# Patient Record
Sex: Female | Born: 1960 | Race: White | Hispanic: No | Marital: Married | Smoking: Never smoker
Health system: Southern US, Community
[De-identification: ages and names within clinical notes are randomized; demographics above are authoritative.]

## PROBLEM LIST (undated history)

## (undated) DIAGNOSIS — F32A Depression, unspecified: Secondary | ICD-10-CM

## (undated) DIAGNOSIS — R51 Headache: Secondary | ICD-10-CM

## (undated) DIAGNOSIS — F329 Major depressive disorder, single episode, unspecified: Secondary | ICD-10-CM

## (undated) DIAGNOSIS — M1990 Unspecified osteoarthritis, unspecified site: Secondary | ICD-10-CM

## (undated) DIAGNOSIS — I499 Cardiac arrhythmia, unspecified: Secondary | ICD-10-CM

## (undated) DIAGNOSIS — K219 Gastro-esophageal reflux disease without esophagitis: Secondary | ICD-10-CM

## (undated) DIAGNOSIS — D219 Benign neoplasm of connective and other soft tissue, unspecified: Secondary | ICD-10-CM

## (undated) DIAGNOSIS — E669 Obesity, unspecified: Secondary | ICD-10-CM

## (undated) DIAGNOSIS — R519 Headache, unspecified: Secondary | ICD-10-CM

## (undated) DIAGNOSIS — K760 Fatty (change of) liver, not elsewhere classified: Secondary | ICD-10-CM

## (undated) HISTORY — PX: DILATION AND CURETTAGE OF UTERUS: SHX78

## (undated) HISTORY — PX: APPENDECTOMY: SHX54

## (undated) HISTORY — PX: CHOLECYSTECTOMY: SHX55

## (undated) HISTORY — DX: Fatty (change of) liver, not elsewhere classified: K76.0

## (undated) HISTORY — PX: COLONOSCOPY: SHX174

## (undated) HISTORY — DX: Benign neoplasm of connective and other soft tissue, unspecified: D21.9

---

## 1997-04-21 ENCOUNTER — Ambulatory Visit (HOSPITAL_COMMUNITY): Admission: RE | Admit: 1997-04-21 | Discharge: 1997-04-21 | Payer: Self-pay | Admitting: Obstetrics and Gynecology

## 1997-05-10 ENCOUNTER — Inpatient Hospital Stay (HOSPITAL_COMMUNITY): Admission: AD | Admit: 1997-05-10 | Discharge: 1997-05-12 | Payer: Self-pay | Admitting: Obstetrics and Gynecology

## 1997-05-10 ENCOUNTER — Encounter (HOSPITAL_COMMUNITY): Admission: RE | Admit: 1997-05-10 | Discharge: 1997-05-11 | Payer: Self-pay | Admitting: Obstetrics and Gynecology

## 1997-06-02 ENCOUNTER — Ambulatory Visit: Admission: RE | Admit: 1997-06-02 | Discharge: 1997-06-03 | Payer: Self-pay | Admitting: General Surgery

## 1998-12-20 ENCOUNTER — Encounter: Payer: Self-pay | Admitting: Obstetrics and Gynecology

## 1998-12-20 ENCOUNTER — Ambulatory Visit (HOSPITAL_COMMUNITY): Admission: RE | Admit: 1998-12-20 | Discharge: 1998-12-20 | Payer: Self-pay | Admitting: Obstetrics and Gynecology

## 1999-01-22 ENCOUNTER — Ambulatory Visit (HOSPITAL_COMMUNITY): Admission: RE | Admit: 1999-01-22 | Discharge: 1999-01-22 | Payer: Self-pay | Admitting: Obstetrics and Gynecology

## 1999-01-22 ENCOUNTER — Encounter: Payer: Self-pay | Admitting: Obstetrics and Gynecology

## 1999-08-22 ENCOUNTER — Other Ambulatory Visit: Admission: RE | Admit: 1999-08-22 | Discharge: 1999-08-22 | Payer: Self-pay | Admitting: Obstetrics and Gynecology

## 1999-08-22 ENCOUNTER — Ambulatory Visit (HOSPITAL_COMMUNITY): Admission: RE | Admit: 1999-08-22 | Discharge: 1999-08-22 | Payer: Self-pay | Admitting: Obstetrics and Gynecology

## 1999-08-22 ENCOUNTER — Encounter: Payer: Self-pay | Admitting: Obstetrics and Gynecology

## 1999-11-05 ENCOUNTER — Other Ambulatory Visit: Admission: RE | Admit: 1999-11-05 | Discharge: 1999-11-05 | Payer: Self-pay | Admitting: Obstetrics and Gynecology

## 2002-06-06 ENCOUNTER — Ambulatory Visit (HOSPITAL_COMMUNITY): Admission: RE | Admit: 2002-06-06 | Discharge: 2002-06-06 | Payer: Self-pay | Admitting: Obstetrics and Gynecology

## 2002-06-06 ENCOUNTER — Encounter: Payer: Self-pay | Admitting: Obstetrics and Gynecology

## 2003-08-18 ENCOUNTER — Encounter: Admission: RE | Admit: 2003-08-18 | Discharge: 2003-08-18 | Payer: Self-pay | Admitting: Obstetrics and Gynecology

## 2004-06-25 ENCOUNTER — Ambulatory Visit: Payer: Self-pay | Admitting: Nurse Practitioner

## 2005-03-05 ENCOUNTER — Encounter: Admission: RE | Admit: 2005-03-05 | Discharge: 2005-03-05 | Payer: Self-pay | Admitting: Obstetrics and Gynecology

## 2009-05-21 ENCOUNTER — Observation Stay: Payer: Self-pay | Admitting: Internal Medicine

## 2009-09-19 ENCOUNTER — Ambulatory Visit: Payer: Self-pay | Admitting: Gastroenterology

## 2012-05-05 ENCOUNTER — Ambulatory Visit: Payer: Self-pay | Admitting: Gastroenterology

## 2012-05-07 LAB — PATHOLOGY REPORT

## 2012-06-23 ENCOUNTER — Ambulatory Visit: Payer: Self-pay | Admitting: Family

## 2012-06-24 ENCOUNTER — Ambulatory Visit: Payer: Self-pay | Admitting: Family

## 2012-08-10 ENCOUNTER — Ambulatory Visit: Payer: Self-pay | Admitting: Family

## 2012-08-24 ENCOUNTER — Ambulatory Visit: Payer: Self-pay | Admitting: Family

## 2013-12-09 ENCOUNTER — Observation Stay: Payer: Self-pay | Admitting: Internal Medicine

## 2013-12-09 LAB — CBC
HCT: 41.9 % (ref 35.0–47.0)
HGB: 13.7 g/dL (ref 12.0–16.0)
MCH: 28.5 pg (ref 26.0–34.0)
MCHC: 32.6 g/dL (ref 32.0–36.0)
MCV: 87 fL (ref 80–100)
Platelet: 246 10*3/uL (ref 150–440)
RBC: 4.8 10*6/uL (ref 3.80–5.20)
RDW: 14 % (ref 11.5–14.5)
WBC: 8.1 10*3/uL (ref 3.6–11.0)

## 2013-12-09 LAB — BASIC METABOLIC PANEL
Anion Gap: 9 (ref 7–16)
BUN: 11 mg/dL (ref 7–18)
CO2: 27 mmol/L (ref 21–32)
Calcium, Total: 8.6 mg/dL (ref 8.5–10.1)
Chloride: 105 mmol/L (ref 98–107)
Creatinine: 0.94 mg/dL (ref 0.60–1.30)
EGFR (African American): >60
EGFR (Non-African Amer.): >60
Glucose: 81 mg/dL (ref 65–99)
Osmolality: 280 (ref 275–301)
Potassium: 3.8 mmol/L (ref 3.5–5.1)
Sodium: 141 mmol/L (ref 136–145)

## 2013-12-09 LAB — HEPATIC FUNCTION PANEL A (ARMC)
ALK PHOS: 81 U/L
ALT: 39 U/L
Albumin: 3.8 g/dL (ref 3.4–5.0)
Bilirubin, Direct: 0.1 mg/dL (ref 0.00–0.20)
Bilirubin,Total: 0.5 mg/dL (ref 0.2–1.0)
SGOT(AST): 29 U/L (ref 15–37)
Total Protein: 6.8 g/dL (ref 6.4–8.2)

## 2013-12-09 LAB — TROPONIN I: Troponin-I: <0.02 ng/mL

## 2013-12-09 LAB — LIPASE, BLOOD: LIPASE: 83 U/L (ref 73–393)

## 2013-12-10 LAB — CK-MB
CK-MB: 1 ng/mL (ref 0.5–3.6)
CK-MB: 1 ng/mL (ref 0.5–3.6)
CK-MB: 1.2 ng/mL (ref 0.5–3.6)

## 2013-12-10 LAB — TROPONIN I: Troponin-I: <0.02 ng/mL

## 2014-06-15 ENCOUNTER — Emergency Department
Admit: 2014-06-15 | Disposition: A | Discharge status: Home / Self Care | Payer: Self-pay | Admitting: Emergency Medicine

## 2014-06-15 LAB — COMPREHENSIVE METABOLIC PANEL
ALBUMIN: 4.4 g/dL
ALK PHOS: 66 U/L
Anion Gap: 7 (ref 7–16)
BILIRUBIN TOTAL: 0.7 mg/dL
BUN: 11 mg/dL
CREATININE: 0.94 mg/dL
Calcium, Total: 9.1 mg/dL
Chloride: 107 mmol/L
Co2: 24 mmol/L
EGFR (African American): >60
EGFR (Non-African Amer.): >60
GLUCOSE: 97 mg/dL
Potassium: 4.4 mmol/L
SGOT(AST): 36 U/L
SGPT (ALT): 33 U/L
SODIUM: 138 mmol/L
Total Protein: 7.4 g/dL

## 2014-06-15 LAB — TROPONIN I

## 2014-06-15 LAB — CBC
HCT: 40.6 % (ref 35.0–47.0)
HGB: 13.6 g/dL (ref 12.0–16.0)
MCH: 29.4 pg (ref 26.0–34.0)
MCHC: 33.4 g/dL (ref 32.0–36.0)
MCV: 88 fL (ref 80–100)
Platelet: 221 10*3/uL (ref 150–440)
RBC: 4.61 10*6/uL (ref 3.80–5.20)
RDW: 13.9 % (ref 11.5–14.5)
WBC: 5.5 10*3/uL (ref 3.6–11.0)

## 2014-06-15 LAB — CK TOTAL AND CKMB (NOT AT ARMC)
CK, Total: 117 U/L
CK-MB: 2.5 ng/mL

## 2014-06-15 LAB — PROTIME-INR
INR: 0.9
Prothrombin Time: 12.4 secs

## 2014-06-15 LAB — APTT: ACTIVATED PTT: 28.1 s (ref 23.6–35.9)

## 2014-06-17 NOTE — Discharge Summary (Signed)
PATIENT NAME:  Nancy Sanford, Nancy Sanford MR#:  [id number] DATE OF BIRTH:  13-Jul-1960  DATE OF ADMISSION:  12/09/2013 DATE OF DISCHARGE:  12/10/2013  PRIMARY CARE PHYSICIAN: Irven Easterly. Kary Kos, MD.  FINAL DIAGNOSES: 1. Chest pain, likely gastroesophageal reflux disease.  2. Irritable bowel syndrome.  3. Obesity, body mass index 44.6.   MEDICATIONS ON DISCHARGE: Include: Multivitamin 1 tablet daily, dicyclomine 10 mg 2 capsules twice a day, Celexa 20 mg daily, omeprazole 20 mg 1 capsule twice a day. Stop taking Aleve.   HOSPITAL COURSE: Recommended to elevate the head of the bed at night. Diet: Regular diet, bland diet. Avoid spicy foods, regular consistency. Follow up with  Dr. Kary Kos in 1 to 2 weeks. The patient was admitted as an observation 12/09/2013 and discharged 12/10/2013. Came in with chest pain believed to be gastroesophageal reflux in nature by the admitting physician. Serial cardiac enzymes were done. No further testing was ordered. Laboratory data that was done in the emergency room included a lipase of 83. Liver function tests normal range. Glucose 81, BUN 11, creatinine 0.94, sodium 141, potassium 3.8, chloride 105, CO2 of 27, calcium 8.6. White blood cell count 8.1, H and H 13.7 and 41.9, platelet count 246,000. Troponin negative x 3. Chest x-ray negative. CT scan of the chest: No pulmonary embolism. Ectatic ascending thoracic aorta measuring 3.7 cm, but no evidence of aortic dissection.  1. On 10/17, the patient was feeling well. Her chest pain was described as pain coming off the center of her chest, radiating also across the top of her abdomen. Happens only at night for the past couple nights, but last night here in the hospital, it was okay. The patient does take Aleve at home. I believe this is likely secondary to gastroesophageal reflux disease. I increased her omeprazole to 20 mg twice a day, stopping Aleve and watching dietary modifications. Follow up with Dr. Kary Kos as an outpatient.   2. For IBS, she is on dicyclomine.  3. For obesity, BMI 44.6, weight loss is needed.   TIME SPENT ON DISCHARGE: 35 minutes.     ____________________________ Tana Conch. Leslye Peer, MD rjw:TT D: 12/10/2013 14:26:14 ET T: 12/10/2013 14:47:55 ET JOB#: [id number]  cc: Tana Conch. Leslye Peer, MD, <Dictator> Irven Easterly. Kary Kos, MD Marisue Brooklyn MD ELECTRONICALLY SIGNED 12/11/2013 12:51

## 2014-06-17 NOTE — H&P (Signed)
PATIENT NAME:  Nancy Sanford, Nancy Sanford MR#:  [id number] DATE OF BIRTH:  Aug 18, 1960  DATE OF ADMISSION:  12/09/2013  PRIMARY CARE PHYSICIAN: Kerin Perna, MD  REFERRING PHYSICIAN: Baird Cancer. Jacqualine Code, MD  CHIEF COMPLAINT: Chest pain.  HISTORY OF PRESENT ILLNESS: Nancy Sanford is a 54 year old female with a history of IBS, gastroesophageal reflux disease, osteoarthritis, obesity, comes to the Emergency Department with complaints of chest pain. The patient states she has been feeling generalized weakness for over the last 1 month; however, in the last 2 days, the patient has been experiencing severe pain, especially lying down in the bed; chest pain, pressure-like pain. The patient denies having any chest pain with exertion. This pain was associated with shortness of breath. Concerning this, came to the Emergency Department.   Workup in the Emergency Department with EKG and cardiac enzymes was unremarkable.  Concerning the patient's multiple risk factors, the decision was made to admit the patient for further observation.  PAST MEDICAL HISTORY:  1.  Irritable bowel syndrome.  2.  Gastroesophageal reflux disease.  3.  Osteoarthritis. 4.  Irregular heartbeats. 5.  Migraine headaches.   PAST SURGICAL HISTORY:  1.  Cholecystectomy. 2.  Appendectomy.  ALLERGIES: No known drug allergies.  HOME MEDICATIONS: 1.  Omeprazole 20 mg once a day. 2.  Multivitamin once a day. 3.  Dicyclomine 20 mg 3 times a day. 4.  Celexa 20 mg once a day. 5.  Aleve 220 mg 2 capsules once a day.   SOCIAL HISTORY: No history of smoking, drinking alcohol or using illicit drugs. Married; lives with her husband.  FAMILY HISTORY: Father living; has a history of coronary artery bypass grafting. Mother with no medical problems.  REVIEW OF SYSTEMS:  CONSTITUTIONAL: Experiencing generalized weakness. EYES: No change in vision. EARS, NOSE, AND THROAT: No change in hearing.  RESPIRATORY: No cough or shortness of breath.   CARDIOVASCULAR: Has chest pain. No palpitations.  GASTROINTESTINAL: Has gastroesophageal reflux disease.  GENITOURINARY: No dysuria or hematuria. HEMATOLOGIC: No easy bruising or bleeding.  SKIN: No rash or lesions. MUSCULOSKELETAL: Has history of osteoarthritis.   PHYSICAL EXAMINATION:  GENERAL: This is a well built, well nourished female with a BMI of 45, who comes to the Emergency Department with a complaint of chest pain. She is lying down in the bed, not in distress. VITAL SIGNS: Temperature 98.2, pulse 59, blood pressure 123/76, respiratory rate of 16, oxygen saturation 95% on room air. HEENT: Head normocephalic, atraumatic. There is no scleral icterus. Conjunctivae are normal. Pupils are equal, round, react to light. Mucous membranes moist. No pharyngeal erythema. NECK: Supple. No lymphadenopathy. No JVD. No carotid bruit.  CHEST: No focal tenderness. Lungs bilaterally clear to auscultation.  HEART: S1 and S2, regular. No murmurs are heard. ABDOMEN: Bowel sounds present. Soft. Has tenderness in the epigastric area. No rebound or guarding.  EXTREMITIES: No pedal edema. Pulses 2+. NEUROLOGIC: The patient is alert, oriented to place, person and time. Cranial nerves II through XII intact. Motor 5/5 in the upper and lower extremities.   LABORATORIES: Laboratory workup is completely unremarkable. Troponin less than 0.02.   ELECTROCARDIOGRAM, 12 LEAD: Normal sinus rhythm. No ST-T wave abnormalities.   CHEST X-RAY, PA AND LATERAL: No active disease in the chest.  CT OF THE CHEST: No acute cardiopulmonary disease. A small amount of pericardial fluid. Ectatic ascending thoracic aorta measuring up to 3.7 cm.   ASSESSMENT AND PLAN: Nancy Sanford is a 54 year old female who comes to the Emergency Department with chest  pain upon lying down in the bed.  1.  Chest pain. Seems to be more from a gastrointestinal cause. Admit the patient to a monitored bed, cycle cardiac enzymes x 3. If negative, the  patient can be discharged to home and have a stress test done as an outpatient.  2.  Gastroesophageal reflux disease. Keep the patient on Protonix. 3.  Hypertension currently well controlled. 4.  Keep the patient on deep venous thrombosis prophylaxis with Lovenox.   TIME SPENT: 50 minutes     ____________________________ Monica Becton, MD pv:MT D: 12/10/2013 04:50:00 ET T: 12/10/2013 06:09:50 ET JOB#: [id number]  cc: Monica Becton, MD, <Dictator> Kerin Perna, MD Monica Becton MD ELECTRONICALLY SIGNED 12/24/2013 23:17

## 2014-06-19 ENCOUNTER — Emergency Department
Admit: 2014-06-19 | Disposition: A | Discharge status: Home / Self Care | Payer: Self-pay | Admitting: Emergency Medicine

## 2014-06-19 LAB — BASIC METABOLIC PANEL
Anion Gap: 7 (ref 7–16)
BUN: 12 mg/dL
CALCIUM: 9.6 mg/dL
CREATININE: 0.92 mg/dL
Chloride: 106 mmol/L
Co2: 27 mmol/L
EGFR (African American): >60
Glucose: 115 mg/dL — ABNORMAL HIGH
Potassium: 4.2 mmol/L
SODIUM: 140 mmol/L

## 2014-06-19 LAB — CBC
HCT: 40.5 % (ref 35.0–47.0)
HGB: 14.2 g/dL (ref 12.0–16.0)
MCH: 30.5 pg (ref 26.0–34.0)
MCHC: 34.9 g/dL (ref 32.0–36.0)
MCV: 87 fL (ref 80–100)
Platelet: 258 10*3/uL (ref 150–440)
RBC: 4.65 10*6/uL (ref 3.80–5.20)
RDW: 13.9 % (ref 11.5–14.5)
WBC: 6.7 10*3/uL (ref 3.6–11.0)

## 2014-06-19 LAB — PRO B NATRIURETIC PEPTIDE: B-Type Natriuretic Peptide: 27 pg/mL

## 2014-06-19 LAB — TROPONIN I: Troponin-I: <0.03 ng/mL

## 2016-01-25 ENCOUNTER — Encounter: Payer: Self-pay | Admitting: Emergency Medicine

## 2016-01-25 ENCOUNTER — Emergency Department
Admission: EM | Admit: 2016-01-25 | Discharge: 2016-01-25 | Disposition: A | Discharge status: Home / Self Care | Payer: BC Managed Care – PPO | Attending: Emergency Medicine | Admitting: Emergency Medicine

## 2016-01-25 ENCOUNTER — Emergency Department: Payer: BC Managed Care – PPO

## 2016-01-25 DIAGNOSIS — R079 Chest pain, unspecified: Secondary | ICD-10-CM | POA: Diagnosis present

## 2016-01-25 DIAGNOSIS — R42 Dizziness and giddiness: Secondary | ICD-10-CM | POA: Insufficient documentation

## 2016-01-25 DIAGNOSIS — R0789 Other chest pain: Secondary | ICD-10-CM | POA: Diagnosis not present

## 2016-01-25 HISTORY — DX: Cardiac arrhythmia, unspecified: I49.9

## 2016-01-25 LAB — BASIC METABOLIC PANEL
Anion gap: 9 (ref 5–15)
BUN: 11 mg/dL (ref 6–20)
CHLORIDE: 109 mmol/L (ref 101–111)
CO2: 22 mmol/L (ref 22–32)
Calcium: 9.5 mg/dL (ref 8.9–10.3)
Creatinine, Ser: 1 mg/dL (ref 0.44–1.00)
GFR calc non Af Amer: >60 mL/min (ref >60)
Glucose, Bld: 86 mg/dL (ref 65–99)
POTASSIUM: 4.3 mmol/L (ref 3.5–5.1)
SODIUM: 140 mmol/L (ref 135–145)

## 2016-01-25 LAB — CBC
HEMATOCRIT: 41.6 % (ref 35.0–47.0)
Hemoglobin: 14.2 g/dL (ref 12.0–16.0)
MCH: 29.5 pg (ref 26.0–34.0)
MCHC: 34.1 g/dL (ref 32.0–36.0)
MCV: 86.5 fL (ref 80.0–100.0)
Platelets: 191 10*3/uL (ref 150–440)
RBC: 4.81 MIL/uL (ref 3.80–5.20)
RDW: 14.5 % (ref 11.5–14.5)
WBC: 4.7 10*3/uL (ref 3.6–11.0)

## 2016-01-25 LAB — TROPONIN I
Troponin I: <0.03 ng/mL (ref <0.03)
Troponin I: <0.03 ng/mL (ref <0.03)

## 2016-01-25 MED ORDER — KETOROLAC TROMETHAMINE 30 MG/ML IJ SOLN
30.0000 mg | Freq: Once | INTRAMUSCULAR | Status: AC
  Administered 2016-01-25: 30 mg via INTRAVENOUS
  Filled 2016-01-25: qty 1

## 2016-01-25 NOTE — ED Provider Notes (Signed)
[facility] Emergency Department Provider Note  ____________________________________________   First MD Initiated Contact with Patient 01/25/16 0940     (approximate)  I have reviewed the triage vital signs and the nursing notes.   HISTORY  Chief Complaint Chest Pain   HPI Nancy Sanford is a 55 y.o. female with a history of an irregular heartbeatand a family history of coronary artery disease was presenting with 1 day of left-sided chest pain. Says that the chest pain feels like an aching and has been radiating from the left upper chest to the center chest as well as to left arm. She says that she has had some dizziness but denies any shortness of breath, nausea, vomiting or diaphoresis. She says that the chest pain has been constant and a 5 but for about 30 seconds to 1 minute will elevate up to a 10. She says that it feels tight when she moves her arm and that 4 days ago she rates some leaves and thought that she may have pulled a muscle. However, she took Tylenol without any relief last night.   Past Medical History:  Diagnosis Date  . Irregular heart beat     There are no active problems to display for this patient.   Past Surgical History:  Procedure Laterality Date  . APPENDECTOMY    . CHOLECYSTECTOMY      Prior to Admission medications   Medication Sig Start Date End Date Taking? Authorizing Provider  Multiple Vitamin (MULTIVITAMIN) tablet Take 1 tablet by mouth daily.   Yes Historical Provider, MD    Allergies Patient has no known allergies.  No family history on file.  Social History Social History  Substance Use Topics  . Smoking status: Never Smoker  . Smokeless tobacco: Never Used  . Alcohol use No    Review of Systems Constitutional: No fever/chills Eyes: No visual changes. ENT: No sore throat. Cardiovascular: As above Respiratory: Denies shortness of breath. Gastrointestinal: No abdominal pain.  No nausea, no  vomiting.  No diarrhea.  No constipation. Genitourinary: Negative for dysuria. Musculoskeletal: Negative for back pain. Skin: Negative for rash. Neurological: Negative for headaches, focal weakness or numbness.  10-point ROS otherwise negative.  ____________________________________________   PHYSICAL EXAM:  VITAL SIGNS: ED Triage Vitals [01/25/16 0916]  Enc Vitals Group     BP 132/63     Pulse Rate (!) 58     Resp 18     Temp 98 F (36.7 C)     Temp Source Oral     SpO2 100 %     Weight 230 lb (104.3 kg)     Height 5\' 2"  (1.575 m)     Head Circumference      Peak Flow      Pain Score 5     Pain Loc      Pain Edu?      Excl. in [place]?     Constitutional: Alert and oriented. Well appearing and in no acute distress. Eyes: Conjunctivae are normal. PERRL. EOMI. Head: Atraumatic. Nose: No congestion/rhinnorhea. Mouth/Throat: Mucous membranes are moist.  Neck: No stridor.   Cardiovascular: Normal rate, regular rhythm. Grossly normal heart sounds.  Equal bilateral radial pulses. Reproducible tenderness when pushing on the left side of the chest. Respiratory: Normal respiratory effort.  No retractions. Lungs CTAB. Gastrointestinal: Soft and nontender. No distention. No CVA tenderness. Musculoskeletal: No lower extremity tenderness nor edema.  No joint effusions. Neurologic:  Normal speech and language. No gross focal  neurologic deficits are appreciated.  Skin:  Skin is warm, dry and intact. No rash noted. Psychiatric: Mood and affect are normal. Speech and behavior are normal.  ____________________________________________   LABS (all labs ordered are listed, but only abnormal results are displayed)  Labs Reviewed  BASIC METABOLIC PANEL  CBC  TROPONIN I  TROPONIN I   ____________________________________________  EKG  ED ECG REPORT I, [place],  Youlanda Roys, the attending physician, personally viewed and interpreted this ECG.   Date: 01/25/2016  EKG Time: 0922  Rate:  58  Rhythm: normal sinus rhythm  Axis: Normal axis  Intervals:none  ST&T Change: No ST segment elevation or depression. T-wave inversions in V2 and V3.  ____________________________________________  RADIOLOGY DG Chest 2 View (Final result)  Result time 01/25/16 10:15:35  Final result by Lowella Grip III, MD (01/25/16 10:15:35)           Narrative:   CLINICAL DATA: Left-sided chest pain radiating into left upper extremity. Shortness of breath. Dizziness.  EXAM: CHEST 2 VIEW  COMPARISON: June 15, 2014  FINDINGS: Lungs are clear. Heart size and pulmonary vascularity are normal. No adenopathy. No bone lesions. No pneumothorax.  IMPRESSION: No edema or consolidation.   Electronically Signed By: Lowella Grip III M.D. On: 01/25/2016 10:15             ____________________________________________   PROCEDURES  Procedure(s) performed:   Procedures  Critical Care performed:   ____________________________________________   INITIAL IMPRESSION / ASSESSMENT AND PLAN / ED COURSE  Pertinent labs & imaging results that were available during my care of the patient were reviewed by me and considered in my medical decision making (see chart for details).   Clinical Course    ----------------------------------------- 1:31 PM on 01/25/2016 -----------------------------------------  Patient resting comfortably at this time and says that her pain is relieved after the Toradol. Very reassuring lab workup. Nonspecific changes on the EKG. Previous EKGs with inverted T waves in V2. Now with an inverted T-wave in V2 as well as V3. However, history and physical strongly point towards chest wall pain. Will be discharged home. Will be following up with cardiology in the office. She understands plan and is willing to comply as well as to return for any worsening or concerning symptoms.  ____________________________________________   FINAL CLINICAL  IMPRESSION(S) / ED DIAGNOSES  Chest wall pain.    NEW MEDICATIONS STARTED DURING THIS VISIT:  New Prescriptions   No medications on file     Note:  This document was prepared using Dragon voice recognition software and may include unintentional dictation errors.    Orbie Pyo, MD 01/25/16 1332

## 2016-01-25 NOTE — ED Notes (Signed)
Attempted to start IV x1. Unable to get Iv.

## 2016-01-25 NOTE — ED Notes (Signed)
Patient transported to X-ray 

## 2016-01-25 NOTE — ED Triage Notes (Signed)
Pt reports left side chest pain that radiates to left arm that began last night. Pt reports SOB, dizziness and lightheadedness. Pt describes pain as squeezing. Pt ambulatory to triage. No apparent distress noted.

## 2016-08-08 ENCOUNTER — Ambulatory Visit (INDEPENDENT_AMBULATORY_CARE_PROVIDER_SITE_OTHER): Payer: BC Managed Care – PPO | Admitting: Certified Nurse Midwife

## 2016-08-08 ENCOUNTER — Encounter: Payer: Self-pay | Admitting: Certified Nurse Midwife

## 2016-08-08 VITALS — BP 133/83 | HR 59 | Ht 62.0 in | Wt 184.5 lb

## 2016-08-08 DIAGNOSIS — Z01419 Encounter for gynecological examination (general) (routine) without abnormal findings: Secondary | ICD-10-CM

## 2016-08-08 DIAGNOSIS — N941 Unspecified dyspareunia: Secondary | ICD-10-CM

## 2016-08-08 MED ORDER — SERTRALINE HCL 100 MG PO TABS: 100.0000 mg | ORAL_TABLET | Freq: Every day | ORAL | 0 refills | Status: DC

## 2016-08-08 NOTE — Patient Instructions (Signed)
Atrophic Vaginitis Atrophic vaginitis is when the tissues that line the vagina become dry and thin. This is caused by a drop in estrogen. Estrogen helps:  To keep the vagina moist.  To make a clear fluid that helps: ? To lubricate the vagina for sex. ? To protect the vagina from infection.  If the lining of the vagina is dry and thin, it may:  Make sex painful. It may also cause bleeding.  Cause a feeling of: ? Burning. ? Irritation. ? Itchiness.  Make an exam of your vagina painful. It may also cause bleeding.  Make you lose interest in sex.  Cause a burning feeling when you pee.  Make your vaginal fluid (discharge) brown or yellow.  For some women, there are no symptoms. This condition is most common in women who do not get their regular menstrual periods anymore (menopause). This often starts when a woman is 67-65 years old. Follow these instructions at home:  Take medicines only as told by your doctor. Do not use any herbal or alternative medicines unless your doctor says it is okay.  Use over-the-counter products for dryness only as told by your doctor. These include: ? Creams. ? Lubricants. ? Moisturizers.  Do not douche.  Do not use products that can make your vagina dry. These include: ? Scented feminine sprays. ? Scented tampons. ? Scented soaps.  If it hurts to have sex, tell your sexual partner. Contact a doctor if:  Your discharge looks different than normal.  Your vagina has an unusual smell.  You have new symptoms.  Your symptoms do not get better with treatment.  Your symptoms get worse. This information is not intended to replace advice given to you by your health care provider. Make sure you discuss any questions you have with your health care provider. Document Released: 07/30/2007 Document Revised: 07/19/2015 Document Reviewed: 02/01/2014 Elsevier Interactive Patient Education  2018 [employer]. Persistent Depressive Disorder Persistent  depressive disorder (PDD) is a mental health condition. PDD causes symptoms of low-level depression for 2 years or longer. It may also be called long-term (chronic) depression or dysthymia. PDD may include episodes of more severe depression that last for about 2 weeks (major depressive disorder or MDD). PDD can affect the way you think, feel, and sleep. This condition may also affect your relationships. You may be more likely to get sick if you have PDD. Symptoms of PDD occur for most of the day and may include:  Feeling tired (fatigue).  Low energy.  Eating too much or too little.  Sleeping too much or too little.  Feeling restless or agitated.  Feeling hopeless.  Feeling worthless or guilty.  Feeling worried or nervous (anxiety).  Trouble concentrating or making decisions.  Low self-esteem.  A negative way of looking at things ([place]).  Not being able to have fun or feel pleasure.  Avoiding interacting with people.  Getting angry or annoyed easily (irritability).  Acting aggressive or angry.  Follow these instructions at home: Activity  Go back to your normal activities as told by your doctor.  Exercise regularly as told by your doctor. General instructions  Take over-the-counter and prescription medicines only as told by your doctor.  Do not drink alcohol. Or, limit how much alcohol you drink to no more than 1 drink a day for nonpregnant women and 2 drinks a day for men. One drink equals 12 oz of beer, 5 oz of wine, or 1 oz of hard liquor. Alcohol can affect any  antidepressant medicines you are taking. Talk with your doctor about your alcohol use.  Eat a healthy diet and get plenty of sleep.  Find activities that you enjoy each day.  Consider joining a support group. Your doctor may be able to suggest a support group.  Keep all follow-up visits as told by your doctor. This is important. Where to find more information: [employer] on Mental  Illness  www.nami.org  U.S. National Institute of Mental Health  https://carter.com/  National Suicide Prevention Lifeline  332-787-8268 954-254-8273). This is free, 24-hour help.  Contact a doctor if:  Your symptoms get worse.  You have new symptoms.  You have trouble sleeping or doing your daily activities. Get help right away if:  You self-harm.  You have serious thoughts about hurting yourself or others.  You see, hear, taste, smell, or feel things that are not there (hallucinate). This information is not intended to replace advice given to you by your health care provider. Make sure you discuss any questions you have with your health care provider. Document Released: 01/22/2015 Document Revised: 10/05/2015 Document Reviewed: 10/05/2015 Elsevier Interactive Patient Education  2017 [employer].

## 2016-08-08 NOTE — Progress Notes (Signed)
GYN ENCOUNTER NOTE  Subjective:       Nancy Sanford is a 56 y.o. G70P1011 female is here for gynecologic evaluation of the following issues:  1.Hormonal issues She states that she has been dealing with depression and dyspareunia for 2 years. The depression started when she had to quit her job due to physical issues. She has since lost 75 lbs and is working part time. She states that she is tearful all the time and is concerned about losing her husband because she has not had intercourse in 2 yrs. The last time she had intercourse was painful. She denies use of lubrication. She denies issue with desire of intercourse. She denies any other symptoms.    Gynecologic History No LMP recorded. Patient is postmenopausal. 9-10years Contraception: none Last Pap: 5 yrs ago Results were: normal per pt Last mammogram: 02/2005. Results were: normal. Mammogram ordered.   Obstetric History OB History  Gravida Para Term Preterm AB Living  2 1 1   1 1   SAB TAB Ectopic Multiple Live Births  1       1    # Outcome Date GA Lbr Len/2nd Weight Sex Delivery Anes PTL Lv  2 Term 49    M Vag-Spont   LIV  1 SAB 1998              Past Medical History:  Diagnosis Date  . Fatty liver   . Fibroid   . Irregular heart beat     Past Surgical History:  Procedure Laterality Date  . APPENDECTOMY    . CHOLECYSTECTOMY    . DILATION AND CURETTAGE OF UTERUS      No current outpatient prescriptions on file prior to visit.   No current facility-administered medications on file prior to visit.     No Known Allergies  Social History   Social History  . Marital status: Married    Spouse name: N/A  . Number of children: N/A  . Years of education: N/A   Occupational History  . Not on file.   Social History Main Topics  . Smoking status: Never Smoker  . Smokeless tobacco: Never Used  . Alcohol use No  . Drug use: No  . Sexual activity: No   Other Topics Concern  . Not on file   Social History  Narrative  . No narrative on file    Family History  Problem Relation Age of Onset  . Stroke Mother   . Diabetes Paternal Grandmother     The following portions of the patient's history were reviewed and updated as appropriate: allergies, current medications, past family history, past medical history, past social history, past surgical history and problem list.  Review of Systems Review of Systems - General ROS: negative for - chills, fatigue, fever, hot flashes, malaise or night sweats Hematological and Lymphatic ROS: negative for - bleeding problems or swollen lymph nodes Gastrointestinal ROS: negative for - abdominal pain, blood in stools, change in bowel habits and nausea/vomiting Musculoskeletal ROS: negative for - joint pain, muscle pain or muscular weakness Genito-Urinary ROS: negative for -  dysmenorrhea, dysuria, genital discharge, genital ulcers, hematuria, incontinence, vaginal bleeding, nocturia or pelvic pain. Positive for dyspareunia  Objective:   BP 133/83   Pulse (!) 59   Ht 5\' 2"  (1.575 m)   Wt 184 lb 8 oz (83.7 kg)   BMI 33.75 kg/m  CONSTITUTIONAL: Well-developed, well-nourished female in no acute distress.  HENT:  Normocephalic, atraumatic.  NECK: Normal range  of motion SKIN: Skin is warm and dry. No rash noted. Not diaphoretic. No erythema. No pallor. [place]: Alert and oriented to person, place, and time.  PSYCHIATRIC: Normal affect. tearfu lwith discussion of depression Normal behavior. Normal judgment and thought content. CARDIOVASCULAR:Regular rate, no murmurs  RESPIRATORY: Clear bilaterally  BREASTS: normal bilaterally ABDOMEN: Soft, non distended; Non tender.   PELVIC:  External Genitalia: Normal  BUS: Normal  Vagina: decreased rugae   Cervix: Normal  Uterus: Normal size, shape,consistency, mobile  Adnexa: Normal  RV: Normal   Bladder: Nontender MUSCULOSKELETAL: Normal range of motion. No tenderness.  No cyanosis, clubbing, or  edema.  Assessment:   1. Dyspareunia, female 2. Depression  Plan:  Pap smear today, mammogram ordered. Colonoscopy done at age 72 (pt pt). Encouraged pt to use lubrication with intercourse. She denies history of any cancer, cardiovascular or neurovascular accidents. Sample of Ospermifene 60 mg given with instructions on use.To use after trial of lubrication.  Zoloft 100 mg started. She denies desire to hurt herself and agrees to go to the E.D. If she has the urge to harm herself. She will follow up in 3 wks for medication check. Will call with results of pap smear.   Philip Aspen, CNM

## 2016-08-08 NOTE — Progress Notes (Signed)
New 56 year old here for a pap smear. She would also like to discuss menopause- she has a lot of questions. Her last pap smear was about 5 years and was normal.

## 2016-08-12 ENCOUNTER — Other Ambulatory Visit: Payer: Self-pay | Admitting: Certified Nurse Midwife

## 2016-08-12 ENCOUNTER — Telehealth: Payer: Self-pay | Admitting: Certified Nurse Midwife

## 2016-08-12 LAB — PAP IG AND HPV HIGH-RISK
HPV, HIGH-RISK: NEGATIVE
PAP SMEAR COMMENT: 0

## 2016-08-12 MED ORDER — OSPEMIFENE 60 MG PO TABS: 1.0000 | ORAL_TABLET | Freq: Every day | ORAL | 0 refills | Status: AC

## 2016-08-12 NOTE — Progress Notes (Signed)
Prescription sent for Ospemifene.

## 2016-08-12 NOTE — Telephone Encounter (Signed)
Called placed to review pap smear results. Left message.  Philip Aspen, CNM

## 2016-08-29 ENCOUNTER — Ambulatory Visit (INDEPENDENT_AMBULATORY_CARE_PROVIDER_SITE_OTHER): Payer: BC Managed Care – PPO | Admitting: Certified Nurse Midwife

## 2016-08-29 ENCOUNTER — Encounter: Payer: Self-pay | Admitting: Certified Nurse Midwife

## 2016-08-29 VITALS — BP 98/58 | HR 65 | Ht 62.0 in | Wt 183.9 lb

## 2016-08-29 DIAGNOSIS — Z79899 Other long term (current) drug therapy: Secondary | ICD-10-CM | POA: Diagnosis not present

## 2016-08-29 MED ORDER — SERTRALINE HCL 100 MG PO TABS: 150.0000 mg | ORAL_TABLET | Freq: Every day | ORAL | 0 refills | Status: DC

## 2016-08-29 NOTE — Progress Notes (Signed)
GYN ENCOUNTER NOTE  Subjective:       Nancy Sanford is a 56 y.o. G71P1011 female is here for gynecologic evaluation of the following issues:  1. Medication check.  She was started on 100 mg Zoloft 08/08/16 and has been using Ospermifene 60 mg for dyspareunia. She states that she has had intercourse x 1 and it was not painful. She states that her depression improved initially but she is not back to herself.    Gynecologic History No LMP recorded. Patient is postmenopausal.   Obstetric History OB History  Gravida Para Term Preterm AB Living  2 1 1   1 1   SAB TAB Ectopic Multiple Live Births  1       1    # Outcome Date GA Lbr Len/2nd Weight Sex Delivery Anes PTL Lv  2 Term 71    M Vag-Spont   LIV  1 SAB 1998              Past Medical History:  Diagnosis Date  . Fatty liver   . Fibroid   . Irregular heart beat     Past Surgical History:  Procedure Laterality Date  . APPENDECTOMY    . CHOLECYSTECTOMY    . DILATION AND CURETTAGE OF UTERUS      Current Outpatient Prescriptions on File Prior to Visit  Medication Sig Dispense Refill  . acetaminophen (TYLENOL) 325 MG tablet Take by mouth.    . Multiple Vitamin (MULTI-VITAMINS) TABS Take by mouth.    . Ospemifene 60 MG TABS Take 1 tablet by mouth daily. 30 tablet 0  . pantoprazole (PROTONIX) 20 MG tablet TAKE 1 TABLET EACH DAY BY MOUTH. *NOTE DOSE DECREASE PER PROTOCOL*  0  . sertraline (ZOLOFT) 100 MG tablet Take 1 tablet (100 mg total) by mouth daily. 25 tablet 0   No current facility-administered medications on file prior to visit.     No Known Allergies  Social History   Social History  . Marital status: Married    Spouse name: N/A  . Number of children: N/A  . Years of education: N/A   Occupational History  . Not on file.   Social History Main Topics  . Smoking status: Never Smoker  . Smokeless tobacco: Never Used  . Alcohol use No  . Drug use: No  . Sexual activity: No   Other Topics Concern  . Not  on file   Social History Narrative  . No narrative on file    Family History  Problem Relation Age of Onset  . Stroke Mother   . Diabetes Paternal Grandmother     The following portions of the patient's history were reviewed and updated as appropriate: allergies, current medications, past family history, past medical history, past social history, past surgical history and problem list.  Review of Systems Review of Systems - Negative except As mentioned in HPI Review of Systems - General ROS: negative for - chills, fatigue, fever, hot flashes, malaise or night sweats Hematological and Lymphatic ROS: negative for - bleeding problems or swollen lymph nodes Gastrointestinal ROS: negative for - abdominal pain, blood in stools, change in bowel habits and nausea/vomiting Musculoskeletal ROS: negative for - joint pain, muscle pain or muscular weakness Genito-Urinary ROS: negative for - change in menstrual cycle, dysmenorrhea, dyspareunia, dysuria, genital discharge, genital ulcers, hematuria, incontinence, irregular/heavy menses, nocturia or pelvic pain  Objective:   BP (!) 98/58   Pulse 65   Ht 5\' 2"  (1.575 m)  Wt 183 lb 14.4 oz (83.4 kg)   BMI 33.64 kg/m  CONSTITUTIONAL: Well-developed, well-nourished female in no acute distress.  HENT:  Normocephalic, atraumatic.  NECK: Normal range of motion, SKIN: Skin is warm and dry.  No pallor. [place]: Alert and oriented to person, place, and time.  PSYCHIATRIC: Normal mood and affect. Normal behavior. Normal judgment and thought content. Has periods of depression CARDIOVASCULAR:Not Examined RESPIRATORY: Not Examined BREASTS: Not Examined ABDOMEN: not EXAMINED  PELVIC:NOT EXAMINED MUSCULOSKELETAL: Normal range of motion. No tenderness.  No cyanosis, clubbing, or edema.   Assessment:   Medications Management  Plan:   Continue with Ospemifene, Zoloft increased to 150 mg daily. Return in 3 wks for medication check. Denies desire to  hurt herself.   I attest that more than 50 % of visit was spent reviewing pt history and discussing management plan.   Philip Aspen, CNM

## 2016-08-29 NOTE — Patient Instructions (Signed)
Persistent Depressive Disorder Persistent depressive disorder (PDD) is a mental health condition. PDD causes symptoms of low-level depression for 2 years or longer. It may also be called long-term (chronic) depression or dysthymia. PDD may include episodes of more severe depression that last for about 2 weeks (major depressive disorder or MDD). PDD can affect the way you think, feel, and sleep. This condition may also affect your relationships. You may be more likely to get sick if you have PDD. Symptoms of PDD occur for most of the day and may include:  Feeling tired (fatigue).  Low energy.  Eating too much or too little.  Sleeping too much or too little.  Feeling restless or agitated.  Feeling hopeless.  Feeling worthless or guilty.  Feeling worried or nervous (anxiety).  Trouble concentrating or making decisions.  Low self-esteem.  A negative way of looking at things ([place]).  Not being able to have fun or feel pleasure.  Avoiding interacting with people.  Getting angry or annoyed easily (irritability).  Acting aggressive or angry.  Follow these instructions at home: Activity  Go back to your normal activities as told by your doctor.  Exercise regularly as told by your doctor. General instructions  Take over-the-counter and prescription medicines only as told by your doctor.  Do not drink alcohol. Or, limit how much alcohol you drink to no more than 1 drink a day for nonpregnant women and 2 drinks a day for men. One drink equals 12 oz of beer, 5 oz of wine, or 1 oz of hard liquor. Alcohol can affect any antidepressant medicines you are taking. Talk with your doctor about your alcohol use.  Eat a healthy diet and get plenty of sleep.  Find activities that you enjoy each day.  Consider joining a support group. Your doctor may be able to suggest a support group.  Keep all follow-up visits as told by your doctor. This is important. Where to find more  information: National Alliance on Mental Illness  www.nami.org  [place] National Institute of Mental Health  www.nimh.nih.gov  National Suicide Prevention Lifeline  1-800-273-TALK (1-800-273-8255). This is free, 24-hour help.  Contact a doctor if:  Your symptoms get worse.  You have new symptoms.  You have trouble sleeping or doing your daily activities. Get help right away if:  You self-harm.  You have serious thoughts about hurting yourself or others.  You see, hear, taste, smell, or feel things that are not there (hallucinate). This information is not intended to replace advice given to you by your health care provider. Make sure you discuss any questions you have with your health care provider. Document Released: 01/22/2015 Document Revised: 10/05/2015 Document Reviewed: 10/05/2015 Elsevier Interactive Patient Education  2017 Elsevier Inc.  

## 2016-08-30 ENCOUNTER — Other Ambulatory Visit: Payer: Self-pay | Admitting: Certified Nurse Midwife

## 2016-09-19 ENCOUNTER — Encounter: Payer: Self-pay | Admitting: Certified Nurse Midwife

## 2016-09-19 ENCOUNTER — Ambulatory Visit (INDEPENDENT_AMBULATORY_CARE_PROVIDER_SITE_OTHER): Payer: BC Managed Care – PPO | Admitting: Certified Nurse Midwife

## 2016-09-19 VITALS — BP 96/68 | HR 67 | Ht 62.0 in | Wt 181.6 lb

## 2016-09-19 DIAGNOSIS — Z79899 Other long term (current) drug therapy: Secondary | ICD-10-CM | POA: Diagnosis not present

## 2016-09-19 MED ORDER — SERTRALINE HCL 100 MG PO TABS: 150.0000 mg | ORAL_TABLET | Freq: Every day | ORAL | 5 refills | Status: DC

## 2016-09-19 NOTE — Progress Notes (Signed)
GYN ENCOUNTER NOTE  Subjective:       Nancy Sanford is a 56 y.o. G36P1011 female is here for gynecologic evaluation of the following issues:  1.Medication check.  She was placed on zoloft after seeing me 08/08/16 , the dose was then increased in 08/12/16. She returns today for medication check. She reports that she is feeling like her self again. She is able to function well, has no bad thoughts and has been able to be intimate with her partner. She is smiling and well groomed.    Gynecologic History No LMP recorded. Patient is postmenopausal. Contraception: none Last Pap: 08/08/16 Results were: normal Last mammogram: ordered 08/08/16 Obstetric History OB History  Gravida Para Term Preterm AB Living  2 1 1   1 1   SAB TAB Ectopic Multiple Live Births  1       1    # Outcome Date GA Lbr Len/2nd Weight Sex Delivery Anes PTL Lv  2 Term 1999    M Vag-Spont   LIV  1 SAB 1998              Past Medical History:  Diagnosis Date  . Fatty liver   . Fibroid   . Irregular heart beat     Past Surgical History:  Procedure Laterality Date  . APPENDECTOMY    . CHOLECYSTECTOMY    . DILATION AND CURETTAGE OF UTERUS      Current Outpatient Prescriptions on File Prior to Visit  Medication Sig Dispense Refill  . acetaminophen (TYLENOL) 325 MG tablet Take by mouth.    . Multiple Vitamin (MULTI-VITAMINS) TABS Take by mouth.    . Ospemifene 60 MG TABS Take 1 tablet by mouth daily. 30 tablet 0  . pantoprazole (PROTONIX) 20 MG tablet TAKE 1 TABLET EACH DAY BY MOUTH. *NOTE DOSE DECREASE PER PROTOCOL*  0  . sertraline (ZOLOFT) 100 MG tablet Take 1.5 tablets (150 mg total) by mouth daily. 30 tablet 0   No current facility-administered medications on file prior to visit.     No Known Allergies  Social History   Social History  . Marital status: Married    Spouse name: N/A  . Number of children: N/A  . Years of education: N/A   Occupational History  . Not on file.   Social History Main  Topics  . Smoking status: Never Smoker  . Smokeless tobacco: Never Used  . Alcohol use No  . Drug use: No  . Sexual activity: No   Other Topics Concern  . Not on file   Social History Narrative  . No narrative on file    Family History  Problem Relation Age of Onset  . Stroke Mother   . Diabetes Paternal Grandmother     The following portions of the patient's history were reviewed and updated as appropriate: allergies, current medications, past family history, past medical history, past social history, past surgical history and problem list.  Review of Systems Review of Systems - Negative except as mentioned in HPI Review of Systems - General ROS: negative for - chills, fatigue, fever, hot flashes, malaise or night sweats Hematological and Lymphatic ROS: negative for - bleeding problems or swollen lymph nodes Gastrointestinal ROS: negative for - abdominal pain, blood in stools, change in bowel habits and nausea/vomiting Musculoskeletal ROS: negative for - joint pain, muscle pain or muscular weakness Genito-Urinary ROS: negative for - change in menstrual cycle, dysmenorrhea, dyspareunia, dysuria, genital discharge, genital ulcers, hematuria, incontinence, irregular/heavy menses, nocturia  or pelvic painjj  Objective:   There were no vitals taken for this visit. CONSTITUTIONAL: Well-developed, well-nourished female in no acute distress.  HENT:  Normocephalic, atraumatic.  NECK: Normal range of motion SKIN: Skin is warm and dry. No rash noted. Not diaphoretic. No erythema. No pallor. [place]: Alert and oriented to person, place, and time. PSYCHIATRIC: Normal mood and affect. Normal behavior. Normal judgment and thought content. CARDIOVASCULAR:Not Examined RESPIRATORY: Not Examined BREASTS: Not Examined ABDOMEN: Soft, non distended;  PELVIC:Not examined  MUSCULOSKELETAL: Normal range of motion. No tenderness.  No cyanosis, clubbing, or edema.    Assessment:    Depression   Plan:  Continue with Zoloft 150 mg PO q day. Follow up 1 yr for annual exam or PRN.   Philip Aspen, CNM

## 2016-09-19 NOTE — Patient Instructions (Signed)
Living With Depression Everyone experiences occasional disappointment, sadness, and loss in their lives. When you are feeling down, blue, or sad for at least 2 weeks in a row, it may mean that you have depression. Depression can affect your thoughts and feelings, relationships, daily activities, and physical health. It is caused by changes in the way your brain functions. If you receive a diagnosis of depression, your health care provider will tell you which type of depression you have and what treatment options are available to you. If you are living with depression, there are ways to help you recover from it and also ways to prevent it from coming back. How to cope with lifestyle changes Coping with stress Stress is your body's reaction to life changes and events, both good and bad. Stressful situations may include:  Getting married.  The death of a spouse.  Losing a job.  Retiring.  Having a baby.  Stress can last just a few hours or it can be ongoing. Stress can play a major role in depression, so it is important to learn both how to cope with stress and how to think about it differently. Talk with your health care provider or a counselor if you would like to learn more about stress reduction. He or she may suggest some stress reduction techniques, such as:  Music therapy. This can include creating music or listening to music. Choose music that you enjoy and that inspires you.  Mindfulness-based meditation. This kind of meditation can be done while sitting or walking. It involves being aware of your normal breaths, rather than trying to control your breathing.  Centering prayer. This is a kind of meditation that involves focusing on a spiritual word or phrase. Choose a word, phrase, or sacred image that is meaningful to you and that brings you peace.  Deep breathing. To do this, expand your stomach and inhale slowly through your nose. Hold your breath for 3-5 seconds, then exhale  slowly, allowing your stomach muscles to relax.  Muscle relaxation. This involves intentionally tensing muscles then relaxing them.  Choose a stress reduction technique that fits your lifestyle and personality. Stress reduction techniques take time and practice to develop. Set aside 5-15 minutes a day to do them. Therapists can offer training in these techniques. The training may be covered by some insurance plans. Other things you can do to manage stress include:  Keeping a stress diary. This can help you learn what triggers your stress and ways to control your response.  Understanding what your limits are and saying no to requests or events that lead to a schedule that is too full.  Thinking about how you respond to certain situations. You may not be able to control everything, but you can control how you react.  Adding humor to your life by watching funny films or TV shows.  Making time for activities that help you relax and not feeling guilty about spending your time this way.  Medicines Your health care provider may suggest certain medicines if he or she feels that they will help improve your condition. Avoid using alcohol and other substances that may prevent your medicines from working properly (may interact). It is also important to:  Talk with your pharmacist or health care provider about all the medicines that you take, their possible side effects, and what medicines are safe to take together.  Make it your goal to take part in all treatment decisions (shared decision-making). This includes giving input on the side   effects of medicines. It is best if shared decision-making with your health care provider is part of your total treatment plan.  If your health care provider prescribes a medicine, you may not notice the full benefits of it for 4-8 weeks. Most people who are treated for depression need to be on medicine for at least 6-12 months after they feel better. If you are taking  medicines as part of your treatment, do not stop taking medicines without first talking to your health care provider. You may need to have the medicine slowly decreased (tapered) over time to decrease the risk of harmful side effects. Relationships Your health care provider may suggest family therapy along with individual therapy and drug therapy. While there may not be family problems that are causing you to feel depressed, it is still important to make sure your family learns as much as they can about your mental health. Having your family's support can help make your treatment successful. How to recognize changes in your condition Everyone has a different response to treatment for depression. Recovery from major depression happens when you have not had signs of major depression for two months. This may mean that you will start to:  Have more interest in doing activities.  Feel less hopeless than you did 2 months ago.  Have more energy.  Overeat less often, or have better or improving appetite.  Have better concentration.  Your health care provider will work with you to decide the next steps in your recovery. It is also important to recognize when your condition is getting worse. Watch for these signs:  Having fatigue or low energy.  Eating too much or too little.  Sleeping too much or too little.  Feeling restless, agitated, or hopeless.  Having trouble concentrating or making decisions.  Having unexplained physical complaints.  Feeling irritable, angry, or aggressive.  Get help as soon as you or your family members notice these symptoms coming back. How to get support and help from others How to talk with friends and family members about your condition Talking to friends and family members about your condition can provide you with one way to get support and guidance. Reach out to trusted friends or family members, explain your symptoms to them, and let them know that you are  working with a health care provider to treat your depression. Financial resources Not all insurance plans cover mental health care, so it is important to check with your insurance carrier. If paying for co-pays or counseling services is a problem, search for a local or county mental health care center. They may be able to offer public mental health care services at low or no cost when you are not able to see a private health care provider. If you are taking medicine for depression, you may be able to get the generic form, which may be less expensive. Some makers of prescription medicines also offer help to patients who cannot afford the medicines they need. Follow these instructions at home:  Get the right amount and quality of sleep.  Cut down on using caffeine, tobacco, alcohol, and other potentially harmful substances.  Try to exercise, such as walking or lifting small weights.  Take over-the-counter and prescription medicines only as told by your health care provider.  Eat a healthy diet that includes plenty of vegetables, fruits, whole grains, low-fat dairy products, and lean protein. Do not eat a lot of foods that are high in solid fats, added sugars, or salt.    Keep all follow-up visits as told by your health care provider. This is important. Contact a health care provider if:  You stop taking your antidepressant medicines, and you have any of these symptoms: ? Nausea. ? Headache. ? Feeling lightheaded. ? Chills and body aches. ? Not being able to sleep (insomnia).  You or your friends and family think your depression is getting worse. Get help right away if:  You have thoughts of hurting yourself or others. If you ever feel like you may hurt yourself or others, or have thoughts about taking your own life, get help right away. You can go to your nearest emergency department or call:  Your local emergency services (911 in the [place]  A suicide crisis helpline, such as the  National Suicide Prevention Lifeline at 1-800-273-8255. This is open 24-hours a day.  Summary  If you are living with depression, there are ways to help you recover from it and also ways to prevent it from coming back.  Work with your health care team to create a management plan that includes counseling, stress management techniques, and healthy lifestyle habits. This information is not intended to replace advice given to you by your health care provider. Make sure you discuss any questions you have with your health care provider. Document Released: 01/14/2016 Document Revised: 01/14/2016 Document Reviewed: 01/14/2016 Elsevier Interactive Patient Education  2018 Elsevier Inc.  

## 2016-11-20 ENCOUNTER — Emergency Department: Payer: BC Managed Care – PPO

## 2016-11-20 ENCOUNTER — Emergency Department
Admission: EM | Admit: 2016-11-20 | Discharge: 2016-11-20 | Disposition: A | Discharge status: Home / Self Care | Payer: BC Managed Care – PPO | Attending: Emergency Medicine | Admitting: Emergency Medicine

## 2016-11-20 ENCOUNTER — Encounter: Payer: Self-pay | Admitting: Emergency Medicine

## 2016-11-20 DIAGNOSIS — W01190A Fall on same level from slipping, tripping and stumbling with subsequent striking against furniture, initial encounter: Secondary | ICD-10-CM | POA: Insufficient documentation

## 2016-11-20 DIAGNOSIS — S63502A Unspecified sprain of left wrist, initial encounter: Secondary | ICD-10-CM | POA: Diagnosis not present

## 2016-11-20 DIAGNOSIS — Y92009 Unspecified place in unspecified non-institutional (private) residence as the place of occurrence of the external cause: Secondary | ICD-10-CM | POA: Insufficient documentation

## 2016-11-20 DIAGNOSIS — Y9301 Activity, walking, marching and hiking: Secondary | ICD-10-CM | POA: Diagnosis not present

## 2016-11-20 DIAGNOSIS — Y999 Unspecified external cause status: Secondary | ICD-10-CM | POA: Diagnosis not present

## 2016-11-20 DIAGNOSIS — S6992XA Unspecified injury of left wrist, hand and finger(s), initial encounter: Secondary | ICD-10-CM | POA: Diagnosis present

## 2016-11-20 DIAGNOSIS — Z79899 Other long term (current) drug therapy: Secondary | ICD-10-CM | POA: Insufficient documentation

## 2016-11-20 MED ORDER — ACETAMINOPHEN 500 MG PO TABS
ORAL_TABLET | ORAL | Status: AC
  Filled 2016-11-20: qty 2

## 2016-11-20 MED ORDER — ACETAMINOPHEN 500 MG PO TABS
1000.0000 mg | ORAL_TABLET | Freq: Once | ORAL | Status: AC
  Administered 2016-11-20: 1000 mg via ORAL

## 2016-11-20 NOTE — Discharge Instructions (Signed)
Keep the splint on at all times except when bathing. Keep the splint dry or it will become soft.  Follow up with orthopedics in 1 week for repeat evaluation of your wrist.

## 2016-11-20 NOTE — ED Triage Notes (Signed)
Patient ambulatory to triage with steady gait, without difficulty or distress noted; pt reports tripped and fell last night injuring left wrist

## 2016-11-20 NOTE — ED Provider Notes (Signed)
[facility] Emergency Department Provider Note  ____________________________________________  Time seen: Approximately 8:49 AM  I have reviewed the triage vital signs and the nursing notes.   HISTORY  Chief Complaint Wrist Pain    HPI Nancy Sanford is a 56 y.o. female who complains of left wrist pain and swelling since a trip and fall at home last night. She fell onto a table, tried to brace herself with her left hand and wrist. She did not have immediate pain, but over the next few hours she gradually developed worsening pain and swelling of the wrists. Denies weakness or numbness. Pain is nonradiating, moderate, aching. Worse with movement, better with immobility and rest.     Past Medical History:  Diagnosis Date  . Fatty liver   . Fibroid   . Irregular heart beat      There are no active problems to display for this patient.    Past Surgical History:  Procedure Laterality Date  . APPENDECTOMY    . CHOLECYSTECTOMY    . DILATION AND CURETTAGE OF UTERUS       Prior to Admission medications   Medication Sig Start Date End Date Taking? Authorizing Provider  acetaminophen (TYLENOL) 325 MG tablet Take by mouth.    [provider]  Multiple Vitamin (MULTI-VITAMINS) TABS Take by mouth.    [provider]  Ospemifene 60 MG TABS Take 1 tablet by mouth daily. 08/12/16   Philip Aspen, CNM  pantoprazole (PROTONIX) 20 MG tablet TAKE 1 TABLET EACH DAY BY MOUTH. *NOTE DOSE DECREASE PER PROTOCOL* 06/02/16   [provider]  sertraline (ZOLOFT) 100 MG tablet Take 1.5 tablets (150 mg total) by mouth daily. 09/19/16   Philip Aspen, CNM     Allergies Patient has no known allergies.   Family History  Problem Relation Age of Onset  . Stroke Mother   . Diabetes Paternal Grandmother     Social History Social History  Substance Use Topics  . Smoking status: Never Smoker  . Smokeless tobacco: Never Used  . Alcohol use  No    Review of Systems   Cardiovascular:   No chest pain or syncope. Respiratory:   No dyspnea or cough. Gastrointestinal:   Negative for abdominal pain, vomiting and diarrhea.  Musculoskeletal: left wrist pain as above All other systems reviewed and are negative except as documented above in ROS and HPI.  ____________________________________________   PHYSICAL EXAM:  VITAL SIGNS: ED Triage Vitals  Enc Vitals Group     BP 11/20/16 0433 118/75     Pulse Rate 11/20/16 0433 67     Resp 11/20/16 0433 18     Temp 11/20/16 0433 97.9 F (36.6 C)     Temp Source 11/20/16 0433 Oral     SpO2 11/20/16 0433 100 %     Weight 11/20/16 0432 181 lb (82.1 kg)     Height 11/20/16 0432 5\' 2"  (1.575 m)     Head Circumference --      Peak Flow --      Pain Score 11/20/16 0431 7     Pain Loc --      Pain Edu? --      Excl. in [place]? --     Vital signs reviewed, nursing assessments reviewed.   Constitutional:   Alert and oriented. Well appearing and in no distress. Eyes:   No scleral icterus.  EOMI.    Neck:   Full ROM Cardiovascular:   RRR. Symmetric bilateral radial  and DP pulses.   Respiratory:   Normal respiratory effort without tachypnea/retractions.   Musculoskeletal:   Normal range of motion in all extremities. there is swelling and effusion of the left wrist joint space. Carpals are nontender, but distal ulna and distal radius at the area of the styloids are tender to the touch. There is tenderness at the snuffbox. Full range of motion of the fingers with flexion and extension, intact lumbrical function, full range of motion of the left wrist. Other extremities are unremarkable. Left forearm and proximally is nontender nonswollen and normal. Neurologic:   Normal speech and language.  Motor grossly intact. No gross focal neurologic deficits are appreciated.  Skin:    Skin is warm, dry and intact. No rash noted.  No petechiae, purpura, or  bullae.  ____________________________________________    LABS (pertinent positives/negatives) (all labs ordered are listed, but only abnormal results are displayed) Labs Reviewed - No data to display ____________________________________________   EKG    ____________________________________________    RADIOLOGY  Dg Wrist Complete Left  Result Date: 11/20/2016 CLINICAL DATA:  Left wrist pain after fall. EXAM: LEFT WRIST - COMPLETE 3+ VIEW COMPARISON:  None. FINDINGS: There is no evidence of fracture or dislocation. There is no evidence of arthropathy or other focal bone abnormality. Mild dorsal soft tissue edema. IMPRESSION: No fracture or acute osseous abnormality. Mild dorsal soft tissue edema. Electronically Signed   By: Jeb Levering M.D.   On: 11/20/2016 05:05    ____________________________________________   PROCEDURES Procedures SPLINT APPLICATION Date/Time: 2:22 AM Authorized by: Joni Fears, Naquisha Whitehair Consent: Verbal consent obtained. Risks and benefits: risks, benefits and alternatives were discussed Consent given by: patient Splint applied by: orthopedic technician Location details: left wrist Splint type: thumb spica Supplies used: Ortho-Glass Post-procedure: The splinted body part was neurovascularly unchanged following the procedure. Patient tolerance: Patient tolerated the procedure well with no immediate complications.    ____________________________________________   INITIAL IMPRESSION / ASSESSMENT AND PLAN / ED COURSE  Pertinent labs & imaging results that were available during my care of the patient were reviewed by me and considered in my medical decision making (see chart for details).  patient presents with left wrist pain and swelling after a fall, exam concerning for occult carpal injury versus wrist sprain. Thumb spica applied for support and comfort, continue NSAIDs, follow-up with orthopedics in one week for further evaluation. No evidence  of compartment syndrome soft tissue infection or penetrating injury.      ____________________________________________   FINAL CLINICAL IMPRESSION(S) / ED DIAGNOSES  Final diagnoses:  Sprain of left wrist, initial encounter      New Prescriptions   No medications on file     Portions of this note were generated with dragon dictation software. Dictation errors may occur despite best attempts at proofreading.    Carrie Mew, MD 11/20/16 [phone]

## 2017-02-19 ENCOUNTER — Other Ambulatory Visit: Payer: Self-pay | Admitting: Certified Nurse Midwife

## 2017-03-04 ENCOUNTER — Other Ambulatory Visit: Payer: Self-pay

## 2017-03-04 MED ORDER — SERTRALINE HCL 100 MG PO TABS: 150.0000 mg | ORAL_TABLET | Freq: Every day | ORAL | 5 refills | Status: DC

## 2017-05-07 ENCOUNTER — Other Ambulatory Visit: Payer: Self-pay

## 2017-05-07 MED ORDER — SERTRALINE HCL 100 MG PO TABS: 150.0000 mg | ORAL_TABLET | Freq: Every day | ORAL | 3 refills | Status: AC

## 2017-09-29 ENCOUNTER — Encounter: Payer: Self-pay | Admitting: *Deleted

## 2017-09-30 ENCOUNTER — Encounter
Admission: RE | Disposition: A | Discharge status: Home / Self Care | Payer: Self-pay | Source: Ambulatory Visit | Attending: Internal Medicine

## 2017-09-30 ENCOUNTER — Ambulatory Visit
Admission: RE | Admit: 2017-09-30 | Discharge: 2017-09-30 | Disposition: A | Discharge status: Home / Self Care | Payer: BC Managed Care – PPO | Source: Ambulatory Visit | Attending: Internal Medicine | Admitting: Internal Medicine

## 2017-09-30 ENCOUNTER — Ambulatory Visit: Payer: BC Managed Care – PPO | Admitting: Certified Registered Nurse Anesthetist

## 2017-09-30 ENCOUNTER — Encounter: Payer: Self-pay | Admitting: Internal Medicine

## 2017-09-30 DIAGNOSIS — Z791 Long term (current) use of non-steroidal anti-inflammatories (NSAID): Secondary | ICD-10-CM | POA: Diagnosis not present

## 2017-09-30 DIAGNOSIS — K529 Noninfective gastroenteritis and colitis, unspecified: Secondary | ICD-10-CM | POA: Diagnosis not present

## 2017-09-30 DIAGNOSIS — F329 Major depressive disorder, single episode, unspecified: Secondary | ICD-10-CM | POA: Diagnosis not present

## 2017-09-30 DIAGNOSIS — Z7952 Long term (current) use of systemic steroids: Secondary | ICD-10-CM | POA: Diagnosis not present

## 2017-09-30 DIAGNOSIS — Z7951 Long term (current) use of inhaled steroids: Secondary | ICD-10-CM | POA: Diagnosis not present

## 2017-09-30 DIAGNOSIS — E669 Obesity, unspecified: Secondary | ICD-10-CM | POA: Diagnosis not present

## 2017-09-30 DIAGNOSIS — Z79891 Long term (current) use of opiate analgesic: Secondary | ICD-10-CM | POA: Diagnosis not present

## 2017-09-30 DIAGNOSIS — K640 First degree hemorrhoids: Secondary | ICD-10-CM | POA: Insufficient documentation

## 2017-09-30 DIAGNOSIS — K591 Functional diarrhea: Secondary | ICD-10-CM | POA: Diagnosis present

## 2017-09-30 DIAGNOSIS — K219 Gastro-esophageal reflux disease without esophagitis: Secondary | ICD-10-CM | POA: Diagnosis not present

## 2017-09-30 DIAGNOSIS — M1990 Unspecified osteoarthritis, unspecified site: Secondary | ICD-10-CM | POA: Diagnosis not present

## 2017-09-30 DIAGNOSIS — R194 Change in bowel habit: Secondary | ICD-10-CM | POA: Insufficient documentation

## 2017-09-30 DIAGNOSIS — Z6841 Body Mass Index (BMI) 40.0 and over, adult: Secondary | ICD-10-CM | POA: Insufficient documentation

## 2017-09-30 DIAGNOSIS — Z79899 Other long term (current) drug therapy: Secondary | ICD-10-CM | POA: Insufficient documentation

## 2017-09-30 HISTORY — DX: Obesity, unspecified: E66.9

## 2017-09-30 HISTORY — DX: Headache: R51

## 2017-09-30 HISTORY — DX: Gastro-esophageal reflux disease without esophagitis: K21.9

## 2017-09-30 HISTORY — DX: Benign neoplasm of connective and other soft tissue, unspecified: D21.9

## 2017-09-30 HISTORY — PX: COLONOSCOPY WITH PROPOFOL: SHX5780

## 2017-09-30 HISTORY — DX: Depression, unspecified: F32.A

## 2017-09-30 HISTORY — DX: Unspecified osteoarthritis, unspecified site: M19.90

## 2017-09-30 HISTORY — DX: Major depressive disorder, single episode, unspecified: F32.9

## 2017-09-30 HISTORY — DX: Headache, unspecified: R51.9

## 2017-09-30 HISTORY — DX: Cardiac arrhythmia, unspecified: I49.9

## 2017-09-30 SURGERY — COLONOSCOPY WITH PROPOFOL
Anesthesia: General

## 2017-09-30 MED ORDER — MIDAZOLAM HCL 2 MG/2ML IJ SOLN
INTRAMUSCULAR | Status: AC
  Filled 2017-09-30: qty 2

## 2017-09-30 MED ORDER — PROPOFOL 10 MG/ML IV BOLUS
INTRAVENOUS | Status: DC | PRN
  Administered 2017-09-30: 80 mg via INTRAVENOUS
  Administered 2017-09-30: 20 mg via INTRAVENOUS

## 2017-09-30 MED ORDER — SODIUM CHLORIDE 0.9 % IV SOLN
INTRAVENOUS | Status: DC
  Administered 2017-09-30: 1000 mL via INTRAVENOUS

## 2017-09-30 MED ORDER — MIDAZOLAM HCL 2 MG/2ML IJ SOLN
INTRAMUSCULAR | Status: DC | PRN
  Administered 2017-09-30: 2 mg via INTRAVENOUS

## 2017-09-30 MED ORDER — PROPOFOL 500 MG/50ML IV EMUL
INTRAVENOUS | Status: DC | PRN
  Administered 2017-09-30: 160 ug/kg/min via INTRAVENOUS

## 2017-09-30 MED ORDER — PROPOFOL 10 MG/ML IV BOLUS
INTRAVENOUS | Status: AC
  Filled 2017-09-30: qty 20

## 2017-09-30 NOTE — Transfer of Care (Signed)
Immediate Anesthesia Transfer of Care Note  Patient: Nancy Sanford  Procedure(s) Performed: COLONOSCOPY WITH PROPOFOL (N/A )  Patient Location: PACU  Anesthesia Type:General  Level of Consciousness: sedated  Airway & Oxygen Therapy: Patient Spontanous Breathing and Patient connected to nasal cannula oxygen  Post-op Assessment: Report given to RN and Post -op Vital signs reviewed and stable  Post vital signs: Reviewed and stable  Last Vitals:  Vitals Value Taken Time  BP    Temp    Pulse 80 09/30/2017  2:20 PM  Resp 21 09/30/2017  2:20 PM  SpO2 99 % 09/30/2017  2:20 PM  Vitals shown include unvalidated device data.  Last Pain:  Vitals:   09/30/17 1325  TempSrc: Tympanic  PainSc: 0-No pain      Patients Stated Pain Goal: 0 (14/70/92 9574)  Complications: No apparent anesthesia complications

## 2017-09-30 NOTE — Anesthesia Post-op Follow-up Note (Signed)
Anesthesia QCDR form completed.        

## 2017-09-30 NOTE — Anesthesia Preprocedure Evaluation (Signed)
Anesthesia Evaluation  Patient identified by MRN, date of birth, ID band Patient awake    Reviewed: Allergy & Precautions, H&P , NPO status , reviewed documented beta blocker date and time   Airway Mallampati: II  TM Distance: >3 FB Neck ROM: full    Dental  (+) Chipped   Pulmonary    Pulmonary exam normal        Cardiovascular Normal cardiovascular exam  Stress Test 04/2009 IMPRESSION:  Normal study.  1.     No evidence of ischemia or infarct.  2.     Normal LV systolic function with calculated ejection fraction above  70%.  3.     Negative treadmill ECG test and low risk Duke treadmill score which  is 5.    Neuro/Psych  Headaches, PSYCHIATRIC DISORDERS Depression    GI/Hepatic GERD  Medicated and Controlled,  Endo/Other    Renal/GU      Musculoskeletal  (+) Arthritis ,   Abdominal   Peds  Hematology   Anesthesia Other Findings Past Medical History: No date: Arthritis No date: Depression No date: Fatty liver No date: Fibroid No date: Fibroid tumor No date: GERD (gastroesophageal reflux disease) No date: Headache No date: Irregular heart beat No date: Irregular heart rate No date: Obesity  Past Surgical History: No date: APPENDECTOMY No date: CHOLECYSTECTOMY No date: COLONOSCOPY No date: DILATION AND CURETTAGE OF UTERUS     Reproductive/Obstetrics                             Anesthesia Physical Anesthesia Plan  ASA: II  Anesthesia Plan: General   Post-op Pain Management:    Induction: Intravenous  PONV Risk Score and Plan: Treatment may vary due to age or medical condition and TIVA  Airway Management Planned: Nasal Cannula and Natural Airway  Additional Equipment:   Intra-op Plan:   Post-operative Plan:   Informed Consent: I have reviewed the patients History and Physical, chart, labs and discussed the procedure including the risks, benefits and alternatives  for the proposed anesthesia with the patient or authorized representative who has indicated his/her understanding and acceptance.   Dental Advisory Given  Plan Discussed with: CRNA  Anesthesia Plan Comments:         Anesthesia Quick Evaluation

## 2017-09-30 NOTE — H&P (Signed)
Outpatient short stay form Pre-procedure 09/30/2017 12:56 PM   Seabron Iannello K. Alice Reichert, M.D.  Primary Physician: [place] Pink, M.D.  Reason for visit: Functional diarrhea, change in bowel habits  History of present illness:  Patient with one month or more history of diarrhea, 3-4x per day. No recent travel, sick contacts, lake swimming, etc.   No current facility-administered medications for this encounter.   Medications Prior to Admission  Medication Sig Dispense Refill Last Dose  . azithromycin (ZITHROMAX) 250 MG tablet Take by mouth daily.     . colestipol (COLESTID) 1 g tablet Take 1 g by mouth 2 (two) times daily.     Marland Kitchen dicyclomine (BENTYL) 10 MG capsule Take 10 mg by mouth 4 (four) times daily -  before meals and at bedtime.     . fluticasone (FLONASE) 50 MCG/ACT nasal spray Place 2 sprays into both nostrils daily.     Marland Kitchen HYDROcodone-homatropine (HYCODAN) 5-1.5 MG/5ML syrup Take 5 mLs by mouth every 6 (six) hours as needed for cough.     . naproxen (NAPROSYN) 250 MG tablet Take by mouth daily.     Marland Kitchen omeprazole (PRILOSEC) 20 MG capsule Take 20 mg by mouth daily.     . predniSONE (DELTASONE) 10 MG tablet Take 10 mg by mouth daily with breakfast.     . ranitidine (ZANTAC) 75 MG tablet Take 75 mg by mouth daily.     . traZODone (DESYREL) 50 MG tablet Take 50 mg by mouth at bedtime.     Marland Kitchen acetaminophen (TYLENOL) 325 MG tablet Take by mouth.   Taking  . Multiple Vitamin (MULTI-VITAMINS) TABS Take by mouth.   Taking  . Ospemifene 60 MG TABS Take 1 tablet by mouth daily. 30 tablet 0 Taking  . pantoprazole (PROTONIX) 20 MG tablet TAKE 1 TABLET EACH DAY BY MOUTH. *NOTE DOSE DECREASE PER PROTOCOL*  0 Taking  . sertraline (ZOLOFT) 100 MG tablet Take 1.5 tablets (150 mg total) by mouth daily. 135 tablet 3      No Known Allergies   Past Medical History:  Diagnosis Date  . Arthritis   . Depression   . Fatty liver   . Fibroid   . Fibroid tumor   . GERD (gastroesophageal reflux disease)   .  Headache   . Irregular heart beat   . Irregular heart rate   . Obesity     Review of systems:  Otherwise negative.    Physical Exam  Gen: Alert, oriented. Appears stated age.  HEENT: [place]/AT. PERRLA. Lungs: CTA, no wheezes. CV: RR nl S1, S2. Abd: soft, benign, no masses. BS+ Ext: No edema. Pulses 2+    Planned procedures: Proceed with colonoscopy. The patient understands the nature of the planned procedure, indications, risks, alternatives and potential complications including but not limited to bleeding, infection, perforation, damage to internal organs and possible oversedation/side effects from anesthesia. The patient agrees and gives consent to proceed.  Please refer to procedure notes for findings, recommendations and patient disposition/instructions.     Kwynn Schlotter K. Alice Reichert, M.D. Gastroenterology 09/30/2017  12:56 PM

## 2017-09-30 NOTE — Interval H&P Note (Signed)
History and Physical Interval Note:  09/30/2017 12:58 PM  Nancy Sanford  has presented today for surgery, with the diagnosis of [place]  The various methods of treatment have been discussed with the patient and family. After consideration of risks, benefits and other options for treatment, the patient has consented to  Procedure(s): COLONOSCOPY WITH PROPOFOL (N/A) as a surgical intervention .  The patient's history has been reviewed, patient examined, no change in status, stable for surgery.  I have reviewed the patient's chart and labs.  Questions were answered to the patient's satisfaction.     [place], [place]

## 2017-09-30 NOTE — Op Note (Signed)
[facility]astroenter[facility] Patient Name: Nancy Sanford Procedure Date: 09/30/2017 1:48 PM MRN: [id number] Account #: [id number] Date of Birth: 1961-02-13 Admit Type: Outpatient Age: 57 Room: [facility] ENDO ROOM 4 Gender: Female Note Status: Finalized Procedure:            Colonoscopy Indications:          Functional diarrhea, Change in bowel habits Providers:            Benay Pike. Alice Reichert MD, MD Referring MD:         Irven Easterly. Kary Kos, MD (Referring MD) Medicines:            Propofol per Anesthesia Complications:        No immediate complications. Procedure:            Pre-Anesthesia Assessment:                       - The risks and benefits of the procedure and the                        sedation options and risks were discussed with the                        patient. All questions were answered and informed                        consent was obtained.                       - Patient identification and proposed procedure were                        verified prior to the procedure by the nurse. The                        procedure was verified in the procedure room.                       - ASA Grade Assessment: III - A patient with severe                        systemic disease.                       - After reviewing the risks and benefits, the patient                        was deemed in satisfactory condition to undergo the                        procedure.                       After obtaining informed consent, the colonoscope was                        passed under direct vision. Throughout the procedure,                        the patient's blood pressure, pulse, and oxygen  saturations were monitored continuously. The                        Colonoscope was introduced through the anus and                        advanced to the the cecum, identified by appendiceal                        orifice and ileocecal valve. The colonoscopy was                     performed without difficulty. The colonoscopy was                        somewhat difficult due to restricted mobility of the                        colon. Successful completion of the procedure was aided                        by changing the patient to a prone position and                        applying abdominal pressure. The patient tolerated the                        procedure well. The quality of the bowel preparation                        was good. The ileocecal valve, appendiceal orifice, and                        rectum were photographed. Findings:      The perianal and digital rectal examinations were normal. Pertinent       negatives include normal sphincter tone and no palpable rectal lesions.      The colon (entire examined portion) appeared normal. Biopsies for       histology were taken with a cold forceps from the random colon for       evaluation of microscopic colitis.      Non-bleeding internal hemorrhoids were found during retroflexion. The       hemorrhoids were Grade I (internal hemorrhoids that do not prolapse).      The exam was otherwise without abnormality. Impression:           - The entire examined colon is normal. Biopsied.                       - Non-bleeding internal hemorrhoids.                       - The examination was otherwise normal. Recommendation:       - Patient has a contact number available for                        emergencies. The signs and symptoms of potential                        delayed complications were discussed with the patient.  Return to normal activities tomorrow. Written discharge                        instructions were provided to the patient.                       - Resume previous diet.                       - Continue present medications.                       - Await pathology results.                       - Repeat colonoscopy in 10 years for screening purposes.                        - Return to my office in 3 months.                       - The findings and recommendations were discussed with                        the patient and their family. Procedure Code(s):    --- Professional ---                       [phone], Colonoscopy, flexible; with biopsy, single or                        multiple Diagnosis Code(s):    --- Professional ---                       R19.4, Change in bowel habit                       K59.1, Functional diarrhea                       K64.0, First degree hemorrhoids CPT copyright 2017 American Medical Association. All rights reserved. The codes documented in this report are preliminary and upon coder review may  be revised to meet current compliance requirements. Efrain Sella MD, MD 09/30/2017 2:18:22 PM This report has been signed electronically. Number of Addenda: 0 Note Initiated On: 09/30/2017 1:48 PM Scope Withdrawal Time: 0 hours 7 minutes 26 seconds  Total Procedure Duration: 0 hours 16 minutes 50 seconds       [facility]

## 2017-09-30 NOTE — Anesthesia Procedure Notes (Signed)
Performed by: Haillie Radu, CRNA Pre-anesthesia Checklist: Patient identified, Emergency Drugs available, Suction available, Patient being monitored and Timeout performed Patient Re-evaluated:Patient Re-evaluated prior to induction Oxygen Delivery Method: Nasal cannula Induction Type: IV induction       

## 2017-10-01 NOTE — Anesthesia Postprocedure Evaluation (Signed)
Anesthesia Post Note  Patient: Nancy Sanford  Procedure(s) Performed: COLONOSCOPY WITH PROPOFOL (N/A )  Patient location during evaluation: Endoscopy Anesthesia Type: General Level of consciousness: awake and alert Pain management: pain level controlled Vital Signs Assessment: post-procedure vital signs reviewed and stable Respiratory status: spontaneous breathing, nonlabored ventilation and respiratory function stable Cardiovascular status: blood pressure returned to baseline and stable Postop Assessment: no apparent nausea or vomiting Anesthetic complications: no     Last Vitals:  Vitals:   09/30/17 1325 09/30/17 1421  BP: (!) 123/95 108/78  Pulse: 90   Resp: 20   Temp: (!) 35.8 C (!) 36.1 C  SpO2: 100%     Last Pain:  Vitals:   09/30/17 1441  TempSrc:   PainSc: 0-No pain                 Alphonsus Sias

## 2017-10-02 LAB — SURGICAL PATHOLOGY

## 2019-04-23 ENCOUNTER — Ambulatory Visit: Payer: BC Managed Care – PPO | Attending: Internal Medicine

## 2019-04-23 DIAGNOSIS — Z23 Encounter for immunization: Secondary | ICD-10-CM | POA: Insufficient documentation

## 2019-04-23 NOTE — Progress Notes (Signed)
   Covid-19 Vaccination Clinic  Name:  LYNNEAH NEALEY    MRN: [id number] DOB: 04/14/1960  04/23/2019  Ms. Bordeaux was observed post Covid-19 immunization for 15 minutes without incidence. She was provided with Vaccine Information Sheet and instruction to access the V-Safe system.   Ms. Yankee was instructed to call 911 with any severe reactions post vaccine: Marland Kitchen Difficulty breathing  . Swelling of your face and throat  . A fast heartbeat  . A bad rash all over your body  . Dizziness and weakness    Immunizations Administered    Name Date Dose VIS Date Route   Moderna COVID-19 Vaccine 04/23/2019  9:43 AM 0.5 mL 01/25/2019 Intramuscular   Manufacturer: Moderna   Lot: [id number]   [id number]

## 2019-05-21 ENCOUNTER — Ambulatory Visit: Payer: BC Managed Care – PPO

## 2019-05-21 ENCOUNTER — Ambulatory Visit: Payer: BC Managed Care – PPO | Attending: Internal Medicine

## 2019-05-21 DIAGNOSIS — Z23 Encounter for immunization: Secondary | ICD-10-CM

## 2019-05-21 NOTE — Progress Notes (Signed)
   Covid-19 Vaccination Clinic  Name:  Nancy Sanford    MRN: [id number] DOB: 02/26/1960  05/21/2019  Nancy Sanford was observed post Covid-19 immunization for 15 minutes without incident. She was provided with Vaccine Information Sheet and instruction to access the V-Safe system.   Nancy Sanford was instructed to call 911 with any severe reactions post vaccine: Marland Kitchen Difficulty breathing  . Swelling of face and throat  . A fast heartbeat  . A bad rash all over body  . Dizziness and weakness   Immunizations Administered    Name Date Dose VIS Date Route   Moderna COVID-19 Vaccine 05/21/2019  8:39 AM 0.5 mL 01/25/2019 Intramuscular   Manufacturer: Moderna   Lot: [id number]   [place]:[id number]

## 2019-05-23 ENCOUNTER — Ambulatory Visit: Payer: BC Managed Care – PPO

## 2019-05-25 ENCOUNTER — Ambulatory Visit: Payer: BC Managed Care – PPO

## 2019-05-26 IMAGING — CR DG WRIST COMPLETE 3+V*L*
4 series · 4 of 4 positions shown · non-contrast
Comparison: None.

CLINICAL DATA: Left wrist pain after fall.

EXAM:
LEFT WRIST - COMPLETE 3+ VIEW

[wrist pa]
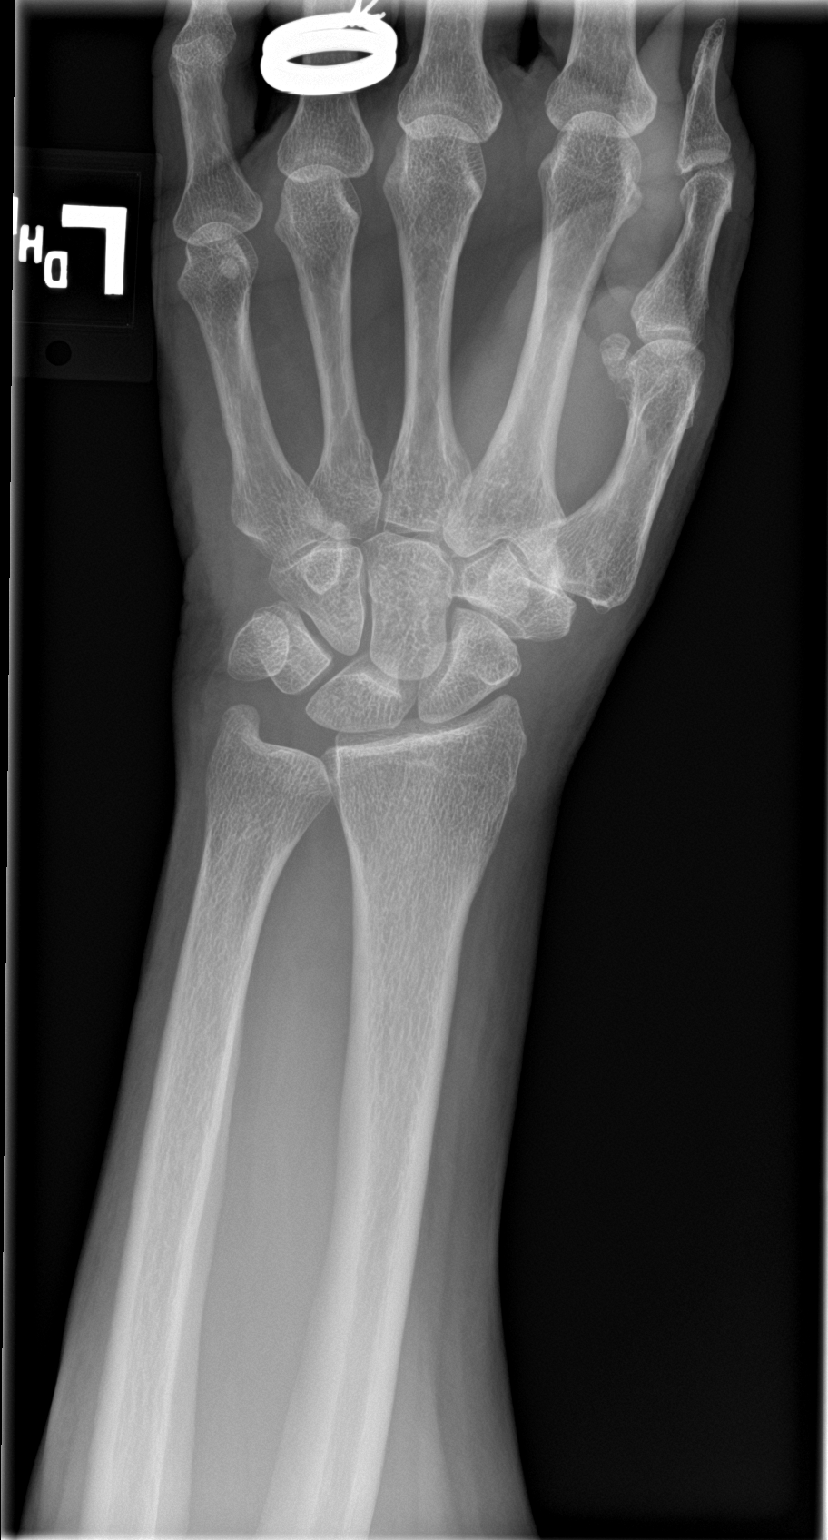

[wrist obl]
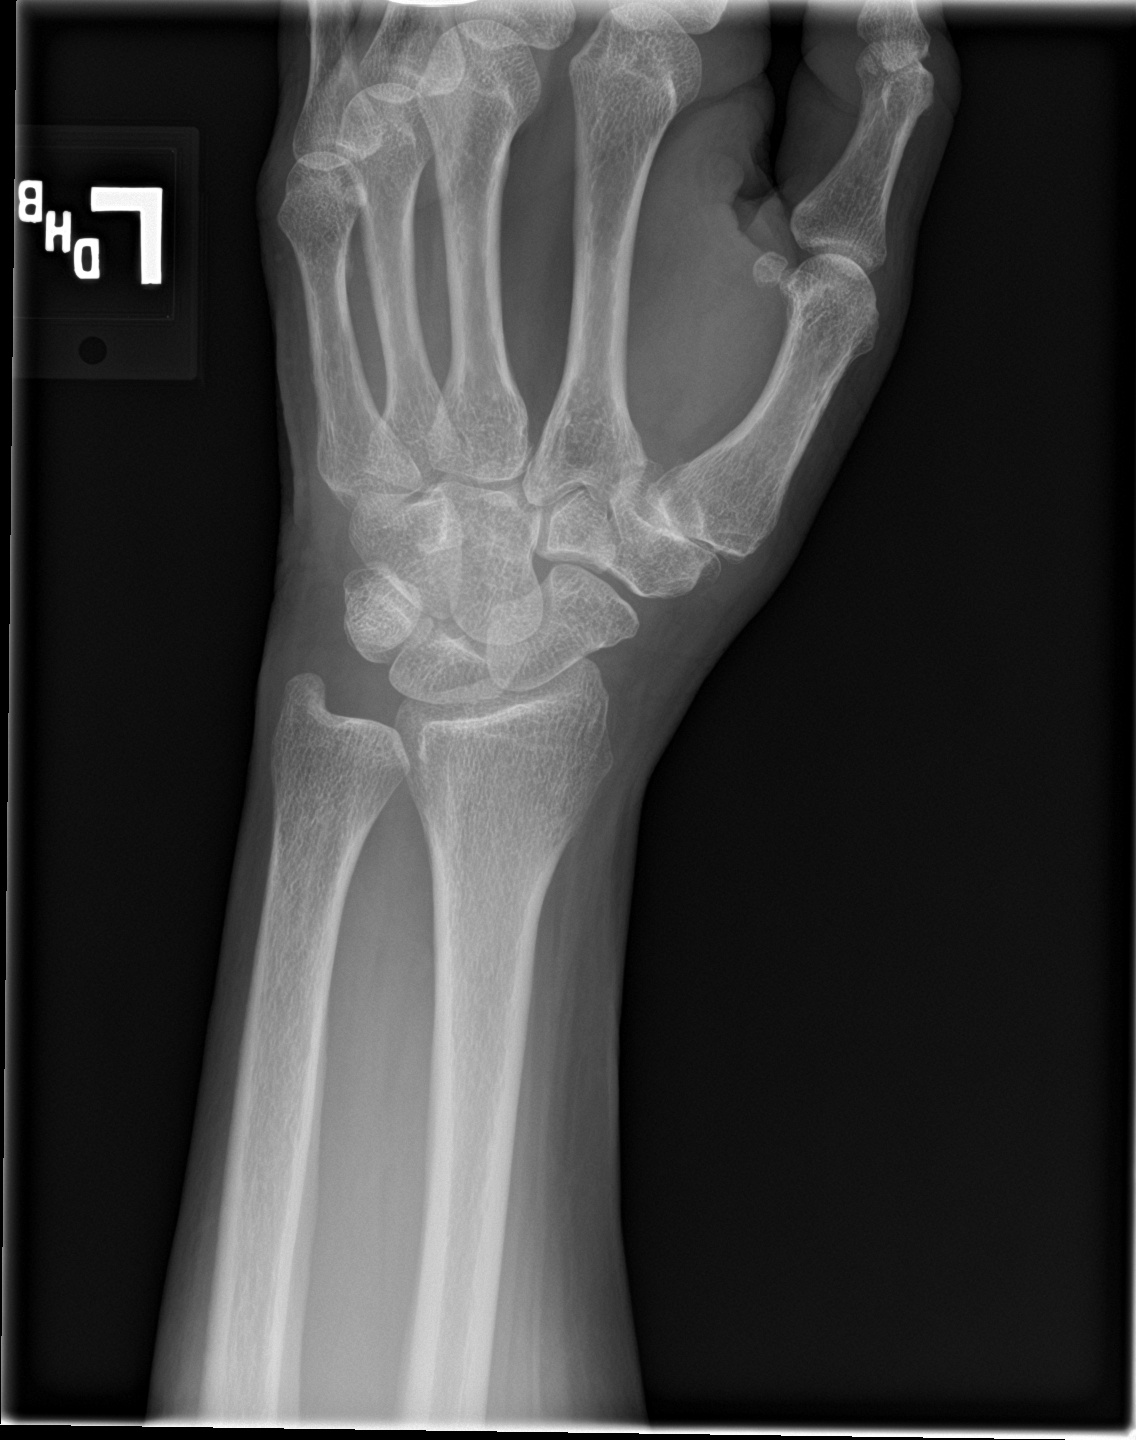

[wrist lat]
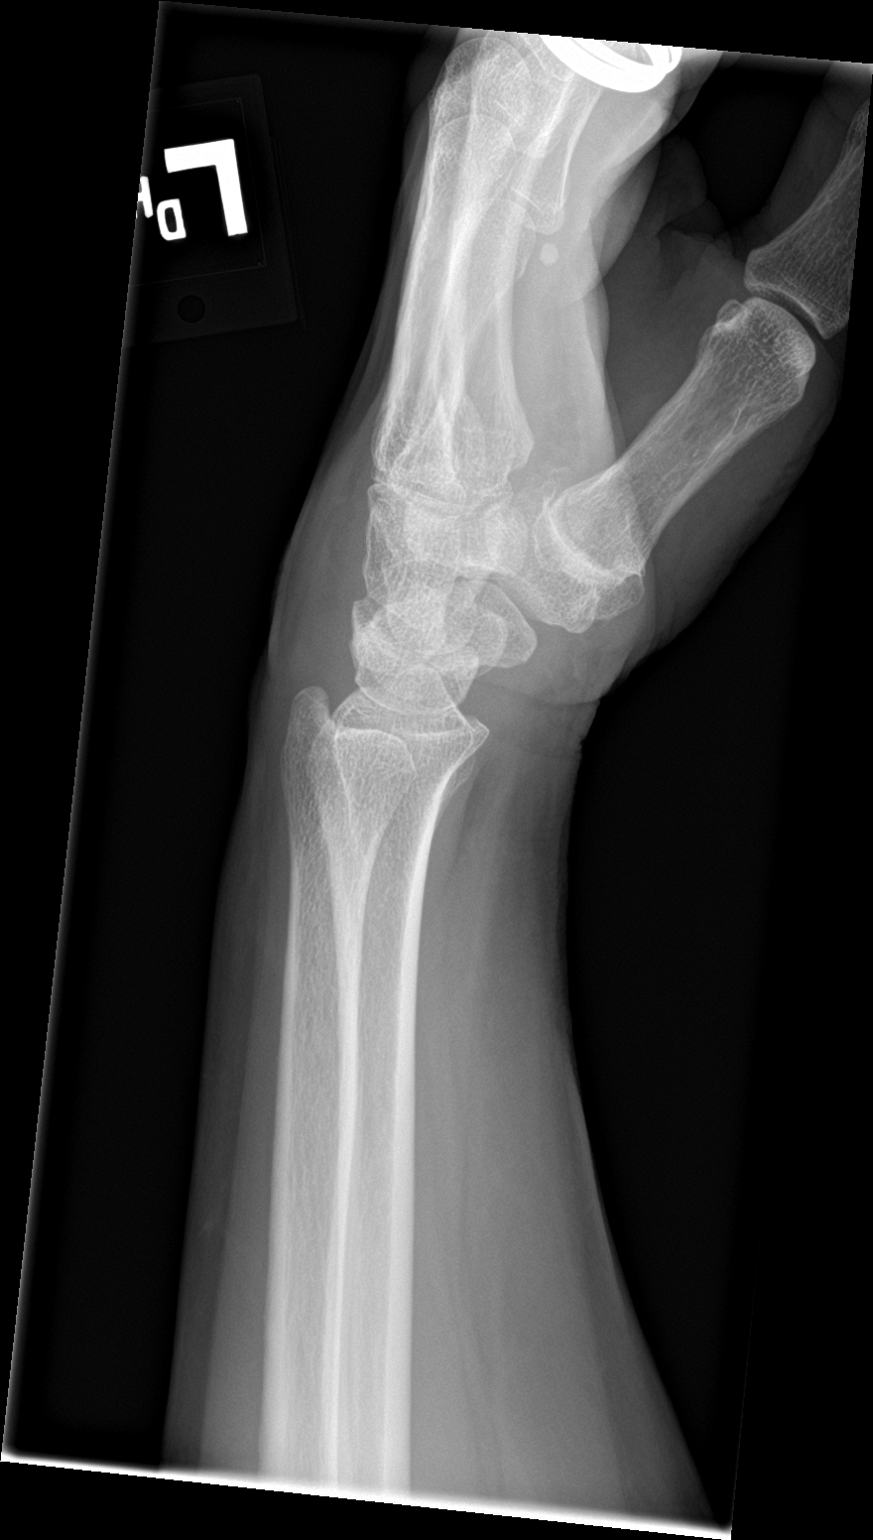

[navicular]
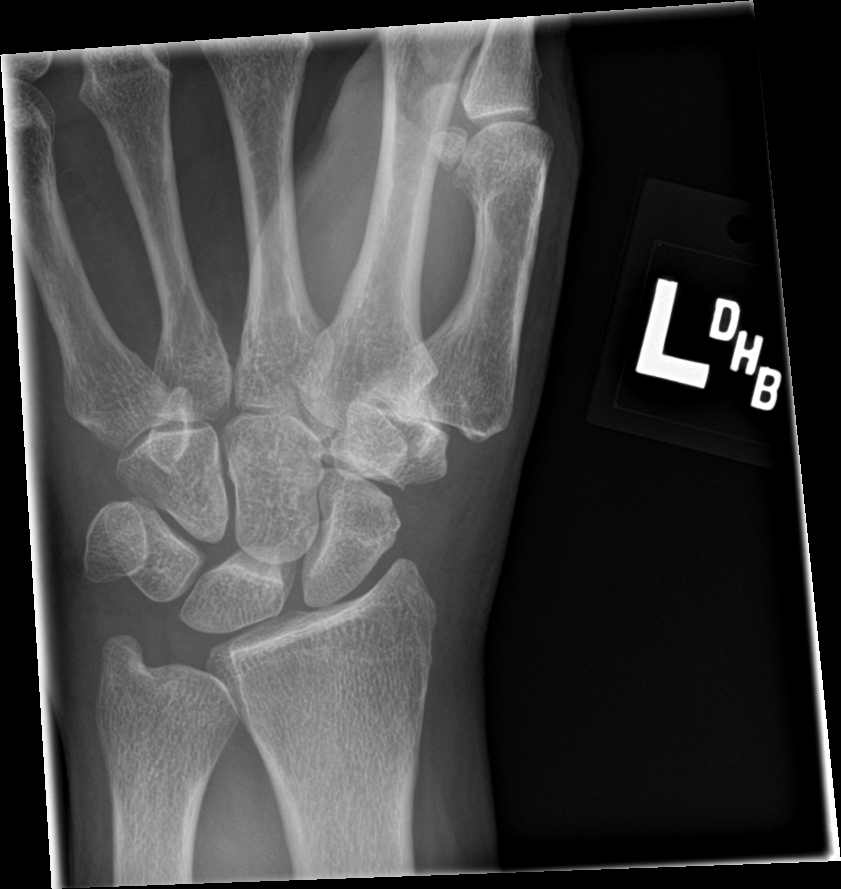

[4 of 4 positions shown; findings below may reference images not displayed]

FINDINGS: There is no evidence of fracture or dislocation. There is no
evidence of arthropathy or other focal bone abnormality. Mild dorsal
soft tissue edema.
IMPRESSION: No fracture or acute osseous abnormality. Mild dorsal soft tissue
edema.

## 2021-10-29 ENCOUNTER — Other Ambulatory Visit: Payer: Self-pay | Admitting: Student in an Organized Health Care Education/Training Program

## 2021-10-29 DIAGNOSIS — Z9884 Bariatric surgery status: Secondary | ICD-10-CM

## 2021-11-05 ENCOUNTER — Ambulatory Visit
Admission: RE | Admit: 2021-11-05 | Discharge: 2021-11-05 | Disposition: A | Discharge status: Home / Self Care | Payer: BC Managed Care – PPO | Source: Ambulatory Visit | Attending: Family Medicine | Admitting: Family Medicine

## 2021-11-05 DIAGNOSIS — Z9884 Bariatric surgery status: Secondary | ICD-10-CM | POA: Insufficient documentation

## 2023-05-30 ENCOUNTER — Emergency Department

## 2023-05-30 ENCOUNTER — Other Ambulatory Visit: Payer: Self-pay

## 2023-05-30 ENCOUNTER — Emergency Department
Admission: EM | Admit: 2023-05-30 | Discharge: 2023-05-30 | Disposition: A | Discharge status: Home / Self Care | Attending: Emergency Medicine | Admitting: Emergency Medicine

## 2023-05-30 DIAGNOSIS — S0990XA Unspecified injury of head, initial encounter: Secondary | ICD-10-CM | POA: Diagnosis present

## 2023-05-30 DIAGNOSIS — W19XXXA Unspecified fall, initial encounter: Secondary | ICD-10-CM | POA: Diagnosis not present

## 2023-05-30 DIAGNOSIS — S0101XA Laceration without foreign body of scalp, initial encounter: Secondary | ICD-10-CM | POA: Insufficient documentation

## 2023-05-30 MED ORDER — ONDANSETRON 4 MG PO TBDP
4.0000 mg | ORAL_TABLET | Freq: Once | ORAL | Status: AC
  Administered 2023-05-30: 4 mg via ORAL
  Filled 2023-05-30: qty 1

## 2023-05-30 MED ORDER — IBUPROFEN 600 MG PO TABS
600.0000 mg | ORAL_TABLET | Freq: Once | ORAL | Status: AC
  Administered 2023-05-30: 600 mg via ORAL
  Filled 2023-05-30: qty 1

## 2023-05-30 MED ORDER — LIDOCAINE-EPINEPHRINE-TETRACAINE (LET) TOPICAL GEL
3.0000 mL | Freq: Once | TOPICAL | Status: AC
  Administered 2023-05-30: 3 mL via TOPICAL
  Filled 2023-05-30: qty 3

## 2023-05-30 MED ORDER — LIDOCAINE HCL (PF) 1 % IJ SOLN
5.0000 mL | Freq: Once | INTRAMUSCULAR | Status: AC
  Administered 2023-05-30: 5 mL via INTRADERMAL
  Filled 2023-05-30: qty 5

## 2023-05-30 NOTE — ED Triage Notes (Signed)
 Patient reports was pulling a wagon and handle came off and fell backwards hitting back on concrete and back of head on metal railing. Patient has lac to back of head.  Denies LOC.

## 2023-05-30 NOTE — ED Provider Notes (Signed)
 [facility] Provider Note    Event Date/Time   First MD Initiated Contact with Patient 05/30/23 1523     (approximate)   History   Fall and Head Injury   HPI  Nancy Sanford is a 63 y.o. female with PMH of GERD, depression who presents for evaluation after a fall where she hit her head today. Patient states she was pulling a wagon and the handle fell off causing her to fall backwards hitting her back on the concrete in the back of her head on a metal railing.  She has a laceration to the back of her head.  No blood thinners no LOC.  Patient reports pain to her upper back and has a headache.      Physical Exam   Triage Vital Signs: ED Triage Vitals [05/30/23 1342]  Encounter Vitals Group     BP 134/79     Systolic BP Percentile      Diastolic BP Percentile      Pulse Rate 71     Resp 20     Temp 98.6 F (37 C)     Temp Source Oral     SpO2 100 %     Weight 250 lb (113.4 kg)     Height 5' 1.5" (1.562 m)     Head Circumference      Peak Flow      Pain Score 10     Pain Loc      Pain Education      Exclude from Growth Chart     Most recent vital signs: Vitals:   05/30/23 1342 05/30/23 1600  BP: 134/79 119/64  Pulse: 71 67  Resp: 20 20  Temp: 98.6 F (37 C)   SpO2: 100% 99%   General: Awake, no distress.  CV:  Good peripheral perfusion.  RRR. Resp:  Normal effort.  CTAB. Abd:  No distention.  Other:  Approximately 2 cm laceration to the back right side of the head, bleeding is controlled.  No focal neurodeficits.  PERRL.  EOM intact.  No ataxia.  Mild tenderness palpation of the thoracic spine.   ED Results / Procedures / Treatments   Labs (all labs ordered are listed, but only abnormal results are displayed) Labs Reviewed - No data to display   RADIOLOGY  CT head, CT cervical spine and thoracic spine x-ray obtained, I interpreted the images as well as reviewed the radiologist report.  No acute abnormalities seen on  imaging.  PROCEDURES:  Critical Care performed: No  .Laceration Repair  Date/Time: 05/30/2023 4:51 PM  Performed by: Cameron Ali, PA-C Authorized by: Cameron Ali, PA-C   Consent:    Consent obtained:  Verbal   Consent given by:  Patient   Risks, benefits, and alternatives were discussed: yes     Risks discussed:  Infection, pain, poor cosmetic result and poor wound healing   Alternatives discussed:  No treatment Universal protocol:    Patient identity confirmed:  Verbally with patient Anesthesia:    Anesthesia method:  Topical application and local infiltration   Topical anesthetic:  LET   Local anesthetic:  Lidocaine 1% w/o epi Laceration details:    Location:  Scalp   Scalp location:  Occipital   Length (cm):  7   Depth (mm):  10 Pre-procedure details:    Preparation:  Imaging obtained to evaluate for foreign bodies Exploration:    Hemostasis achieved with:  LET   Imaging obtained comment:  CT head   Imaging outcome: foreign body not noted     Wound exploration: entire depth of wound visualized     Contaminated: no   Treatment:    Area cleansed with:  Povidone-iodine   Amount of cleaning:  Standard   Irrigation solution:  Tap water   Irrigation method:  Tap Skin repair:    Repair method:  Staples   Number of staples:  6 Approximation:    Approximation:  Close Repair type:    Repair type:  Simple Post-procedure details:    Dressing:  Open (no dressing)   Procedure completion:  Tolerated well, no immediate complications    MEDICATIONS ORDERED IN ED: Medications  lidocaine-EPINEPHrine-tetracaine (LET) topical gel (3 mLs Topical Given by Other 05/30/23 1615)  ondansetron (ZOFRAN-ODT) disintegrating tablet 4 mg (4 mg Oral Given 05/30/23 1550)  ibuprofen (ADVIL) tablet 600 mg (600 mg Oral Given 05/30/23 1549)  lidocaine (PF) (XYLOCAINE) 1 % injection 5 mL (5 mLs Intradermal Given by Other 05/30/23 1630)     IMPRESSION / MDM / ASSESSMENT AND PLAN / ED  COURSE  I reviewed the triage vital signs and the nursing notes.                             63 year old female presents for evaluation of head injury after a fall.  Vital signs are stable patient NAD on exam.  Differential diagnosis includes, but is not limited to, laceration, skull fracture, intracranial bleed, muscle strain.  Patient's presentation is most consistent with acute complicated illness / injury requiring diagnostic workup.  CT head, cervical spine and thoracic spine x-ray were negative.  Physical exam is reassuring.  Laceration repaired as described in the procedure note above.  Patient was instructed on wound care.  Reviewed return precautions.  Patient can take Tylenol and ibuprofen as needed for pain.  She voiced understanding, all questions were answered and she was stable at discharge.       FINAL CLINICAL IMPRESSION(S) / ED DIAGNOSES   Final diagnoses:  Laceration of scalp, initial encounter  Fall in home, initial encounter     Rx / DC Orders   ED Discharge Orders     None        Note:  This document was prepared using Dragon voice recognition software and may include unintentional dictation errors.   Cameron Ali, PA-C 05/30/23 1653    Janith Lima, MD 05/31/23 1505

## 2023-05-30 NOTE — Discharge Instructions (Addendum)
 The CT scan of your head and neck and xray of your back were normal. You can take 650 mg of Tylenol and 600 mg of ibuprofen every 6 hours as needed for pain.  Staples will need to be removed in about 7 days, this can be done by your primary care provider. Wash your hair like normal, but be careful when drying and styling your hair that you don't snag one of the staples.   Return to the ED with any worsening symptoms.
